# Patient Record
Sex: Male | Born: 1985 | Race: Black or African American | Hispanic: No | Marital: Single | Smoking: Current every day smoker
Health system: Southern US, Community
[De-identification: ages and names within clinical notes are randomized; demographics above are authoritative.]

## PROBLEM LIST (undated history)

## (undated) DIAGNOSIS — S02609A Fracture of mandible, unspecified, initial encounter for closed fracture: Secondary | ICD-10-CM

## (undated) DIAGNOSIS — F319 Bipolar disorder, unspecified: Secondary | ICD-10-CM

## (undated) DIAGNOSIS — F209 Schizophrenia, unspecified: Secondary | ICD-10-CM

## (undated) HISTORY — PX: MOUTH SURGERY: SHX715

---
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
## 2006-06-30 ENCOUNTER — Emergency Department (HOSPITAL_COMMUNITY): Admission: EM | Admit: 2006-06-30 | Discharge: 2006-06-30 | Payer: Self-pay | Admitting: Diagnostic Radiology

## 2006-08-20 ENCOUNTER — Emergency Department (HOSPITAL_COMMUNITY): Admission: EM | Admit: 2006-08-20 | Discharge: 2006-08-20 | Payer: Self-pay | Admitting: Emergency Medicine

## 2007-04-14 ENCOUNTER — Emergency Department (HOSPITAL_COMMUNITY): Admission: EM | Admit: 2007-04-14 | Discharge: 2007-04-14 | Payer: Self-pay | Admitting: Emergency Medicine

## 2007-04-16 ENCOUNTER — Emergency Department (HOSPITAL_COMMUNITY): Admission: EM | Admit: 2007-04-16 | Discharge: 2007-04-16 | Payer: Self-pay | Admitting: Diagnostic Radiology

## 2011-06-23 ENCOUNTER — Emergency Department (HOSPITAL_COMMUNITY): Payer: Medicaid Other

## 2011-06-23 ENCOUNTER — Emergency Department (HOSPITAL_COMMUNITY)
Admission: EM | Admit: 2011-06-23 | Discharge: 2011-06-23 | Disposition: A | Discharge status: Home / Self Care | Payer: Medicaid Other | Attending: Emergency Medicine | Admitting: Emergency Medicine

## 2011-06-23 DIAGNOSIS — Z79899 Other long term (current) drug therapy: Secondary | ICD-10-CM | POA: Insufficient documentation

## 2011-06-23 DIAGNOSIS — F209 Schizophrenia, unspecified: Secondary | ICD-10-CM | POA: Insufficient documentation

## 2011-06-23 DIAGNOSIS — F319 Bipolar disorder, unspecified: Secondary | ICD-10-CM | POA: Insufficient documentation

## 2011-06-23 LAB — COMPREHENSIVE METABOLIC PANEL
ALT: 24 U/L (ref 0–53)
BUN: 9 mg/dL (ref 6–23)
CO2: 27 mEq/L (ref 19–32)
Calcium: 9.1 mg/dL (ref 8.4–10.5)
Creatinine, Ser: 0.76 mg/dL (ref 0.50–1.35)
GFR calc Af Amer: >60 mL/min (ref >60)
GFR calc non Af Amer: >60 mL/min (ref >60)
Glucose, Bld: 101 mg/dL — ABNORMAL HIGH (ref 70–99)
Total Protein: 7 g/dL (ref 6.0–8.3)

## 2011-06-23 LAB — URINALYSIS, ROUTINE W REFLEX MICROSCOPIC
Bilirubin Urine: NEGATIVE
Glucose, UA: NEGATIVE mg/dL
Hgb urine dipstick: NEGATIVE
Ketones, ur: NEGATIVE mg/dL
Protein, ur: NEGATIVE mg/dL
Urobilinogen, UA: 1 mg/dL (ref 0.0–1.0)

## 2011-06-23 LAB — CBC
HCT: 37.3 % — ABNORMAL LOW (ref 39.0–52.0)
Hemoglobin: 13.1 g/dL (ref 13.0–17.0)
MCH: 32.6 pg (ref 26.0–34.0)
MCHC: 35.1 g/dL (ref 30.0–36.0)
RDW: 13.5 % (ref 11.5–15.5)

## 2011-06-23 LAB — DIFFERENTIAL
Eosinophils Relative: 0 % (ref 0–5)
Lymphocytes Relative: 20 % (ref 12–46)
Monocytes Absolute: 0.7 10*3/uL (ref 0.1–1.0)
Monocytes Relative: 5 % (ref 3–12)
Neutro Abs: 9.4 10*3/uL — ABNORMAL HIGH (ref 1.7–7.7)

## 2011-06-23 LAB — ETHANOL: Alcohol, Ethyl (B): <11 mg/dL (ref 0–11)

## 2011-06-23 LAB — RAPID URINE DRUG SCREEN, HOSP PERFORMED
Barbiturates: NOT DETECTED
Cocaine: NOT DETECTED

## 2011-07-29 LAB — RAPID URINE DRUG SCREEN, HOSP PERFORMED
Amphetamines: NOT DETECTED
Amphetamines: NOT DETECTED
Benzodiazepines: NOT DETECTED
Benzodiazepines: NOT DETECTED
Cocaine: NOT DETECTED
Cocaine: NOT DETECTED
Opiates: NOT DETECTED
Tetrahydrocannabinol: POSITIVE — AB

## 2011-07-29 LAB — I-STAT 8, (EC8 V) (CONVERTED LAB)
BUN: 9
Bicarbonate: 24.2 — ABNORMAL HIGH
Glucose, Bld: 115 — ABNORMAL HIGH
pCO2, Ven: 35.9 — ABNORMAL LOW

## 2011-07-29 LAB — COMPREHENSIVE METABOLIC PANEL
Albumin: 3.9
BUN: 4 — ABNORMAL LOW
Creatinine, Ser: 0.9
GFR calc Af Amer: >60
Potassium: 3.4 — ABNORMAL LOW
Total Protein: 6.5

## 2011-07-29 LAB — CBC
HCT: 38.1 — ABNORMAL LOW
MCV: 92
Platelets: 238
RDW: 13.1

## 2011-07-29 LAB — DIFFERENTIAL
Lymphocytes Relative: 30
Monocytes Absolute: 0.8 — ABNORMAL HIGH
Monocytes Relative: 8
Neutro Abs: 6.2

## 2011-07-29 LAB — ETHANOL: Alcohol, Ethyl (B): <5

## 2013-07-22 ENCOUNTER — Emergency Department (HOSPITAL_COMMUNITY)
Admission: EM | Admit: 2013-07-22 | Discharge: 2013-07-22 | Disposition: A | Discharge status: Home / Self Care | Payer: Medicaid Other | Attending: Emergency Medicine | Admitting: Emergency Medicine

## 2013-07-22 DIAGNOSIS — F29 Unspecified psychosis not due to a substance or known physiological condition: Secondary | ICD-10-CM | POA: Insufficient documentation

## 2013-07-22 DIAGNOSIS — R748 Abnormal levels of other serum enzymes: Secondary | ICD-10-CM | POA: Insufficient documentation

## 2013-07-22 DIAGNOSIS — Z008 Encounter for other general examination: Secondary | ICD-10-CM

## 2013-07-22 DIAGNOSIS — Z8659 Personal history of other mental and behavioral disorders: Secondary | ICD-10-CM | POA: Insufficient documentation

## 2013-07-22 DIAGNOSIS — Z91199 Patient's noncompliance with other medical treatment and regimen due to unspecified reason: Secondary | ICD-10-CM | POA: Insufficient documentation

## 2013-07-22 DIAGNOSIS — Z9119 Patient's noncompliance with other medical treatment and regimen: Secondary | ICD-10-CM | POA: Insufficient documentation

## 2013-07-22 DIAGNOSIS — Z79899 Other long term (current) drug therapy: Secondary | ICD-10-CM | POA: Insufficient documentation

## 2013-07-22 LAB — CBC
HCT: 39.9 % (ref 39.0–52.0)
Hemoglobin: 14.6 g/dL (ref 13.0–17.0)
MCH: 32.7 pg (ref 26.0–34.0)
MCHC: 36.6 g/dL — ABNORMAL HIGH (ref 30.0–36.0)

## 2013-07-22 LAB — COMPREHENSIVE METABOLIC PANEL WITH GFR
ALT: 57 U/L — ABNORMAL HIGH (ref 0–53)
AST: 183 U/L — ABNORMAL HIGH (ref 0–37)
Albumin: 4.6 g/dL (ref 3.5–5.2)
Alkaline Phosphatase: 52 U/L (ref 39–117)
BUN: 18 mg/dL (ref 6–23)
CO2: 23 meq/L (ref 19–32)
Calcium: 9.5 mg/dL (ref 8.4–10.5)
Chloride: 101 meq/L (ref 96–112)
Creatinine, Ser: 0.98 mg/dL (ref 0.50–1.35)
GFR calc Af Amer: >90 mL/min
GFR calc non Af Amer: >90 mL/min
Glucose, Bld: 167 mg/dL — ABNORMAL HIGH (ref 70–99)
Potassium: 3.4 meq/L — ABNORMAL LOW (ref 3.5–5.1)
Sodium: 138 meq/L (ref 135–145)
Total Bilirubin: 1.5 mg/dL — ABNORMAL HIGH (ref 0.3–1.2)
Total Protein: 7.7 g/dL (ref 6.0–8.3)

## 2013-07-22 LAB — ETHANOL: Alcohol, Ethyl (B): <11 mg/dL (ref 0–11)

## 2013-07-22 MED ORDER — HALOPERIDOL LACTATE 5 MG/ML IJ SOLN
5.0000 mg | Freq: Once | INTRAMUSCULAR | Status: AC
  Administered 2013-07-22: 5 mg via INTRAMUSCULAR
  Filled 2013-07-22: qty 1

## 2013-07-22 MED ORDER — DIPHENHYDRAMINE HCL 50 MG/ML IJ SOLN
50.0000 mg | Freq: Once | INTRAMUSCULAR | Status: AC
  Administered 2013-07-22: 50 mg via INTRAMUSCULAR
  Filled 2013-07-22: qty 1

## 2013-07-22 NOTE — ED Notes (Signed)
Attempted to call [facility] multiple times without success Charge Nurse aware

## 2013-07-22 NOTE — ED Notes (Signed)
Lab results faxed to [facility] at [facility] [phone]

## 2013-07-22 NOTE — ED Notes (Signed)
Update given to [facility] at [facility] All questions answered Patient to be taken back to [facility]

## 2013-07-22 NOTE — ED Notes (Signed)
Patient calm, cooperative and states that he "feels like a new person." Patient meets criteria for removal of restraints, per protocol

## 2013-07-22 NOTE — ED Provider Notes (Signed)
CSN: [id number]     Arrival date & time 07/22/13  4098 History   First MD Initiated Contact with Patient 07/22/13 [phone]     Chief Complaint  Patient presents with  . Medical Clearance   (Consider location/radiation/quality/duration/timing/severity/associated sxs/prior Treatment) HPI 27 year old male presents to emergency department from [facility] with complaint of aggression, agitation.  Patient has been IVC by family to to worsening behavior.  He has a history of bipolar, substance abuse, schizophrenia.  Patient has been prescribed, Cogentin, Haldol.  He has not been taking it.  He has refused these medications at [facility].  He presents for medical clearance and possibly IM medication for his behavior.  Unable to get history from patient as he is responding to internal stimuli, is agitated, and fixated on his parents causing all of his problems. No past medical history on file. No past surgical history on file. No family history on file. History  Substance Use Topics  . Smoking status: Not on file  . Smokeless tobacco: Not on file  . Alcohol Use: Not on file    Review of Systems  Unable to perform ROS: Psychiatric disorder    Allergies  Review of patient's allergies indicates no known allergies.  Home Medications   Current Outpatient Rx  Name  Route  Sig  Dispense  Refill  . benztropine (COGENTIN) 1 MG tablet   Oral   Take 1 mg by mouth daily.         . haloperidol (HALDOL) 5 MG tablet   Oral   Take 5 mg by mouth daily.         Marland Kitchen LORazepam (ATIVAN) 1 MG tablet   Oral   Take 1 mg by mouth every 6 (six) hours as needed for anxiety.          BP 134/64  Pulse 60  Temp(Src) 98.8 F (37.1 C) (Oral)  Resp 16  SpO2 100% Physical Exam  Constitutional: He appears distressed.  Thin male, agitated, verbally abusive, yelling  HENT:  Head: Normocephalic and atraumatic.  Nose: Nose normal.  Mouth/Throat: Oropharynx is clear and moist.  Eyes:  Conjunctivae and EOM are normal. Pupils are equal, round, and reactive to light.  Neck: Normal range of motion. Neck supple. No JVD present. No tracheal deviation present. No thyromegaly present.  Cardiovascular: Normal rate, regular rhythm, normal heart sounds and intact distal pulses.  Exam reveals no gallop and no friction rub.   No murmur heard. Pulmonary/Chest: Effort normal and breath sounds normal. No stridor. No respiratory distress. He has no wheezes. He has no rales. He exhibits no tenderness.  Abdominal: Soft. Bowel sounds are normal. He exhibits no distension and no mass. There is no tenderness. There is no rebound and no guarding.  Musculoskeletal: Normal range of motion. He exhibits no edema and no tenderness.  Lymphadenopathy:    He has no cervical adenopathy.  Neurological: He is alert. He exhibits normal muscle tone. Coordination normal.  Skin: Skin is warm and dry. No rash noted. No erythema. No pallor.  Psychiatric:  Patient is psychotic, responding to internal stimuli, agitated    ED Course  Procedures (including critical care time) Labs Review Labs Reviewed  CBC - Abnormal; Notable for the following:    WBC 12.4 (*)    MCHC 36.6 (*)    All other components within normal limits  COMPREHENSIVE METABOLIC PANEL - Abnormal; Notable for the following:    Potassium 3.4 (*)    Glucose, Bld 167 (*)  AST 183 (*)    ALT 57 (*)    Total Bilirubin 1.5 (*)    All other components within normal limits  ETHANOL  URINE RAPID DRUG SCREEN (HOSP PERFORMED)   Imaging Review No results found.  EKG Interpretation   None       MDM   1. Psychosis    27 year old male with psychosis.  Plan for Haldol and Benadryl.  Ativan as needed.  After he is calmer, will plan for medical clearance labs.  Eventual plan is to return to [place] once calmer.   6:12 AM Pt much calmer.  Labs resulted, elevated AST/ALT and slight elevation in bilirubin.  Possibly from heavy alcohol use.   Will need follow up as outpatient.  No signs of liver failure, jaundice on exam.  Olivia Mackie, MD 07/22/13 [phone]

## 2013-07-22 NOTE — ED Notes (Signed)
Bed: ZO10 Expected date:  Expected time:  Means of arrival:  Comments: [facility] pt coming

## 2013-07-22 NOTE — ED Notes (Signed)
Pt brought in from [facility]-pt aggressive, threatening staff, agitated-will not take po meds-pt is under IVC, here for control and medical clearance.

## 2013-07-23 ENCOUNTER — Emergency Department (HOSPITAL_COMMUNITY)
Admission: EM | Admit: 2013-07-23 | Discharge: 2013-07-23 | Disposition: A | Discharge status: Other Acute Inpatient Hospital | Payer: Medicaid Other | Attending: Emergency Medicine | Admitting: Emergency Medicine

## 2013-07-23 ENCOUNTER — Encounter (HOSPITAL_COMMUNITY): Payer: Self-pay | Admitting: Emergency Medicine

## 2013-07-23 DIAGNOSIS — F209 Schizophrenia, unspecified: Secondary | ICD-10-CM | POA: Insufficient documentation

## 2013-07-23 DIAGNOSIS — Z79899 Other long term (current) drug therapy: Secondary | ICD-10-CM | POA: Insufficient documentation

## 2013-07-23 DIAGNOSIS — IMO0002 Reserved for concepts with insufficient information to code with codable children: Secondary | ICD-10-CM | POA: Insufficient documentation

## 2013-07-23 DIAGNOSIS — F29 Unspecified psychosis not due to a substance or known physiological condition: Secondary | ICD-10-CM | POA: Insufficient documentation

## 2013-07-23 DIAGNOSIS — F319 Bipolar disorder, unspecified: Secondary | ICD-10-CM | POA: Insufficient documentation

## 2013-07-23 DIAGNOSIS — F17200 Nicotine dependence, unspecified, uncomplicated: Secondary | ICD-10-CM | POA: Insufficient documentation

## 2013-07-23 HISTORY — DX: Bipolar disorder, unspecified: F31.9

## 2013-07-23 HISTORY — DX: Schizophrenia, unspecified: F20.9

## 2013-07-23 MED ORDER — ZIPRASIDONE MESYLATE 20 MG IM SOLR
20.0000 mg | Freq: Once | INTRAMUSCULAR | Status: AC
  Administered 2013-07-23: 20 mg via INTRAMUSCULAR
  Filled 2013-07-23: qty 20

## 2013-07-23 MED ORDER — STERILE WATER FOR INJECTION IJ SOLN
INTRAMUSCULAR | Status: AC
  Administered 2013-07-23: 10 mL
  Filled 2013-07-23: qty 10

## 2013-07-23 NOTE — ED Provider Notes (Signed)
CSN: [id number]     Arrival date & time 07/23/13  0042 History   First MD Initiated Contact with Patient 07/23/13 0059     Chief Complaint  Patient presents with  . Medication Management   (Consider location/radiation/quality/duration/timing/severity/associated sxs/prior Treatment) HPI History provided by patient and police. Patient currently under IVC for psychosis with history of schizophrenia,  Bipolar and manic depression and polysubstance abuse.  He had been refusing medications at [facility] and had eloped from that facility tonight.  Police found him and bring him to [facility] long emergency department with orders from [place] to state, if patient is refusing oral medications, the emergency department to "force medications".    In the emergency department, patient remained psychotic police bedside, patient in hand cuffs and ankle cuffs.  Patient tells me that he doesn't need any medications other than marijuana. He refuses antipsychotics do to fear of gynecomastia - he cites a commercial that he saw on TV. He denies any suicidal ideations. Patient is very agitated and expresses desire to not being emergency room and wanting to leave. Has pressured speech, is clearly agitated. Symptoms moderate to severe.   Past Medical History  Diagnosis Date  . Bipolar 1 disorder   . Manic depression   . Schizophrenia    History reviewed. No pertinent past surgical history. No family history on file. History  Substance Use Topics  . Smoking status: Current Every Day Smoker  . Smokeless tobacco: Never Used  . Alcohol Use: Yes    Review of Systems  Constitutional: Negative for fever and chills.  Eyes: Negative for visual disturbance.  Respiratory: Negative for shortness of breath.   Cardiovascular: Negative for chest pain.  Gastrointestinal: Negative for abdominal pain.  Genitourinary: Negative for dysuria.  Musculoskeletal: Negative for back pain, neck pain and neck stiffness.  Skin: Negative  for rash.  Neurological: Negative for headaches.  Psychiatric/Behavioral: Positive for agitation. Negative for self-injury.  All other systems reviewed and are negative.    Allergies  Review of patient's allergies indicates no known allergies.  Home Medications   Current Outpatient Rx  Name  Route  Sig  Dispense  Refill  . benztropine (COGENTIN) 1 MG tablet   Oral   Take 1 mg by mouth daily.         . haloperidol (HALDOL) 5 MG tablet   Oral   Take 5 mg by mouth daily.         Marland Kitchen LORazepam (ATIVAN) 1 MG tablet   Oral   Take 1 mg by mouth every 6 (six) hours as needed for anxiety.          BP 136/86  Pulse 62  Resp 16  SpO2 100% Physical Exam  Constitutional: He appears well-developed and well-nourished.  HENT:  Head: Normocephalic and atraumatic.  Eyes: EOM are normal. Pupils are equal, round, and reactive to light.  Neck: Neck supple.  Cardiovascular: Normal rate, regular rhythm and intact distal pulses.   Pulmonary/Chest: Effort normal and breath sounds normal. No respiratory distress.  Abdominal: Soft. He exhibits no distension. There is no tenderness.  Musculoskeletal: Normal range of motion. He exhibits no edema.  Neurological:  Awake, alert and oriented, gait intact, no focal deficits  Skin: Skin is warm and dry.  Psychiatric:  Psychotic behavior, agitation, pressured speech    ED Course  Procedures (including critical care time)   Results for orders placed during the hospital encounter of 07/22/13  CBC      Result Value Range  WBC 12.4 (*) 4.0 - 10.5 K/uL   RBC 4.47  4.22 - 5.81 MIL/uL   Hemoglobin 14.6  13.0 - 17.0 g/dL   HCT 40.9  81.1 - 91.4 %   MCV 89.3  78.0 - 100.0 fL   MCH 32.7  26.0 - 34.0 pg   MCHC 36.6 (*) 30.0 - 36.0 g/dL   RDW 78.2  95.6 - 21.3 %   Platelets 265  150 - 400 K/uL  COMPREHENSIVE METABOLIC PANEL      Result Value Range   Sodium 138  135 - 145 mEq/L   Potassium 3.4 (*) 3.5 - 5.1 mEq/L   Chloride 101  96 - 112  mEq/L   CO2 23  19 - 32 mEq/L   Glucose, Bld 167 (*) 70 - 99 mg/dL   BUN 18  6 - 23 mg/dL   Creatinine, Ser 0.86  0.50 - 1.35 mg/dL   Calcium 9.5  8.4 - 57.8 mg/dL   Total Protein 7.7  6.0 - 8.3 g/dL   Albumin 4.6  3.5 - 5.2 g/dL   AST 469 (*) 0 - 37 U/L   ALT 57 (*) 0 - 53 U/L   Alkaline Phosphatase 52  39 - 117 U/L   Total Bilirubin 1.5 (*) 0.3 - 1.2 mg/dL   GFR calc non Af Amer >90  >90 mL/min   GFR calc Af Amer >90  >90 mL/min  ETHANOL      Result Value Range   Alcohol, Ethyl (B) <11  0 - 11 mg/dL   Records from ED visit reviewed including labs as above.  Patient had been medically cleared yesterday and sent to [facility].  He is under IVC  Geodon 20 mg IM provided  Case discussed with [facility] and they're willing to take patient back tonight without repeat labs, as long as medications can be provided in the emergency department. Psychiatrist writing orders at [facility] is Dr. Wonda Cerise  2:27 AM patient more calm and is now agreeable.  Discussed again with [facility] and they will take patient back to their facility.  MDM  Dx: Psychosis, IVC  Prior records and labs reviewed as above. Geodon IM provided and condition improved Vital signs and nursing notes reviewed and considered    Sunnie Nielsen, MD 07/23/13 [phone]

## 2013-07-23 NOTE — ED Notes (Signed)
Pt from [facility] was refusing to take Zyprexa 10 mg. Diverted here for medication to be given. Pt is IVC. [facility] states, they "want pt under control before they will accept him back."

## 2013-08-30 ENCOUNTER — Encounter (HOSPITAL_COMMUNITY): Payer: Self-pay | Admitting: Emergency Medicine

## 2013-08-30 ENCOUNTER — Emergency Department (HOSPITAL_COMMUNITY)
Admission: EM | Admit: 2013-08-30 | Discharge: 2013-08-31 | Disposition: A | Discharge status: Home / Self Care | Payer: Medicaid Other | Attending: Emergency Medicine | Admitting: Emergency Medicine

## 2013-08-30 DIAGNOSIS — F17200 Nicotine dependence, unspecified, uncomplicated: Secondary | ICD-10-CM | POA: Insufficient documentation

## 2013-08-30 DIAGNOSIS — Z79899 Other long term (current) drug therapy: Secondary | ICD-10-CM | POA: Insufficient documentation

## 2013-08-30 DIAGNOSIS — F309 Manic episode, unspecified: Secondary | ICD-10-CM | POA: Insufficient documentation

## 2013-08-30 DIAGNOSIS — F209 Schizophrenia, unspecified: Secondary | ICD-10-CM | POA: Insufficient documentation

## 2013-08-30 DIAGNOSIS — F3110 Bipolar disorder, current episode manic without psychotic features, unspecified: Secondary | ICD-10-CM

## 2013-08-30 LAB — CBC
HCT: 41.4 % (ref 39.0–52.0)
MCH: 32.7 pg (ref 26.0–34.0)
MCHC: 34.8 g/dL (ref 30.0–36.0)
MCV: 94.1 fL (ref 78.0–100.0)
Platelets: 233 10*3/uL (ref 150–400)
RDW: 12.6 % (ref 11.5–15.5)
WBC: 12 10*3/uL — ABNORMAL HIGH (ref 4.0–10.5)

## 2013-08-30 LAB — SALICYLATE LEVEL: Salicylate Lvl: <2 mg/dL — ABNORMAL LOW (ref 2.8–20.0)

## 2013-08-30 LAB — COMPREHENSIVE METABOLIC PANEL
AST: 35 U/L (ref 0–37)
Albumin: 4.8 g/dL (ref 3.5–5.2)
BUN: 13 mg/dL (ref 6–23)
CO2: 26 mEq/L (ref 19–32)
Calcium: 10.2 mg/dL (ref 8.4–10.5)
Chloride: 101 mEq/L (ref 96–112)
Creatinine, Ser: 0.93 mg/dL (ref 0.50–1.35)
GFR calc non Af Amer: >90 mL/min (ref >90)
Sodium: 138 mEq/L (ref 135–145)
Total Bilirubin: 0.5 mg/dL (ref 0.3–1.2)

## 2013-08-30 LAB — ETHANOL: Alcohol, Ethyl (B): <11 mg/dL (ref 0–11)

## 2013-08-30 LAB — RAPID URINE DRUG SCREEN, HOSP PERFORMED
Barbiturates: NOT DETECTED
Cocaine: NOT DETECTED

## 2013-08-30 LAB — ACETAMINOPHEN LEVEL: Acetaminophen (Tylenol), Serum: <15 ug/mL (ref 10–30)

## 2013-08-30 MED ORDER — BENZTROPINE MESYLATE 1 MG PO TABS
1.0000 mg | ORAL_TABLET | Freq: Every day | ORAL | Status: DC
  Administered 2013-08-30 – 2013-08-31 (×2): 1 mg via ORAL
  Filled 2013-08-30 (×2): qty 1

## 2013-08-30 MED ORDER — HALOPERIDOL 5 MG PO TABS
5.0000 mg | ORAL_TABLET | Freq: Every day | ORAL | Status: DC
  Administered 2013-08-30 – 2013-08-31 (×2): 5 mg via ORAL
  Filled 2013-08-30 (×2): qty 1

## 2013-08-30 NOTE — ED Notes (Signed)
Pt refuse his vitals 

## 2013-08-30 NOTE — ED Notes (Signed)
Gave patient sandwich and juice

## 2013-08-30 NOTE — ED Notes (Signed)
Pt presents with c/o of medical clearance . Recently incarcerated not following prescribed medication regimen. Hx drug abuse, bipolar, schizophrenic, manic depressive. Family concerned for safety.

## 2013-08-30 NOTE — ED Notes (Signed)
[employer] at bedside 

## 2013-08-30 NOTE — ED Notes (Signed)
Pt denies SI or HI .  
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
## 2013-08-31 ENCOUNTER — Encounter (HOSPITAL_COMMUNITY): Payer: Self-pay | Admitting: Registered Nurse

## 2013-08-31 DIAGNOSIS — Z79899 Other long term (current) drug therapy: Secondary | ICD-10-CM

## 2013-08-31 DIAGNOSIS — F3110 Bipolar disorder, current episode manic without psychotic features, unspecified: Secondary | ICD-10-CM

## 2013-08-31 MED ORDER — ZIPRASIDONE MESYLATE 20 MG IM SOLR: 10.0000 mg | Freq: Once | INTRAMUSCULAR | Status: DC

## 2013-08-31 MED ORDER — ZOLPIDEM TARTRATE 10 MG PO TABS
10.0000 mg | ORAL_TABLET | Freq: Every evening | ORAL | Status: DC | PRN
  Administered 2013-08-31: 10 mg via ORAL
  Filled 2013-08-31: qty 1

## 2013-08-31 MED ORDER — HALOPERIDOL 5 MG PO TABS: 5.0000 mg | ORAL_TABLET | Freq: Every day | ORAL | Status: DC

## 2013-08-31 MED ORDER — LORAZEPAM 2 MG/ML IJ SOLN: 1.0000 mg | Freq: Once | INTRAMUSCULAR | Status: DC

## 2013-08-31 MED ORDER — BENZTROPINE MESYLATE 1 MG PO TABS: 1.0000 mg | ORAL_TABLET | Freq: Every day | ORAL | Status: DC

## 2013-08-31 MED ORDER — ZOLPIDEM TARTRATE 10 MG PO TABS: 10.0000 mg | ORAL_TABLET | Freq: Every evening | ORAL | Status: DC | PRN

## 2013-08-31 NOTE — ED Notes (Signed)
Pt cussing at the police officer on duty . Pt upset that he is IVC'd. Staff explained the door will have to be closed because he is disrupting the unit. Pt yelled" I dont give a fuck. Close the damn door then."

## 2013-08-31 NOTE — ED Notes (Signed)
Pt oriented to unit. Pt requested something for his bunion of his foot. Staff offered gauze to help cushion area. Pt refused. Pt cooperative but slightly anxious. Pt states he is upset with police for bringing him in here. States he does not even want to look at them.
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
## 2013-08-31 NOTE — [facility] Counselor (Addendum)
1015 Pt given bus pass.   1610 Dr. Carolanne Grumbling has reversed the IVC. Original IVC reversal paperwork placed in white IVC NB in psych ED and copy placed in pt's chart. Pt is to be d/c later today when transportation arranged.  Evette Cristal, [place] Assessment Counselor

## 2013-08-31 NOTE — ED Notes (Signed)
Pt stopped yelling and opened his door. Pt laying in bed more calmly. Pt talking to himself in the bed.

## 2013-08-31 NOTE — ED Notes (Signed)
Pt states he was all hyped up to go home and so he feels very anxious and will not be able to sleep. Pt requesting something to help hi, sleep. MD notified,  orders received.

## 2013-08-31 NOTE — ED Notes (Signed)
Patient personal belongings are now in locker 19

## 2013-08-31 NOTE — Consult Note (Signed)
  Subjective:  Patient states that he was brought to hospital straight from jail.  "They wanted to make sure that I was taking my medication and not having any problems.  Yes I will be able to go back home.  I live with my Grandma."  Patient denies suicidal/homicidal ideation, psychosis, and paranoia.  Patient appears pleasant with no pressured speech, and no signs of mania at this time.  Patient organized, coherent and goal directed.  Patient states that he goes to [facility] for outpatient services (medication management).  Patient does not meet criteria for inpatient treatment at this time.  Psychiatric Specialty Exam: Physical Exam  Constitutional: He is oriented to person, place, and time.  Neck: Normal range of motion.  Respiratory: Effort normal.  Musculoskeletal: Normal range of motion.  Neurological: He is alert and oriented to person, place, and time.  Skin: Skin is warm and dry.  Psychiatric: He has a normal mood and affect. His speech is normal and behavior is normal. Judgment and thought content normal. Cognition and memory are normal.    ROS  Blood pressure 108/66, pulse 58, temperature 97.7 F (36.5 C), temperature source Oral, resp. rate 18, SpO2 100.00%.There is no height or weight on file to calculate BMI.  General Appearance: Casual and Fairly Groomed  Eye Contact::  Good  Speech:  Clear and Coherent and Normal Rate  Volume:  Normal  Mood:  "I feel good. I'm ready to go home"  Affect:  Appropriate and Congruent  Thought Process:  NA, Coherent and Goal Directed  Orientation:  Full (Time, Place, and Person)  Thought Content:  WDL  Suicidal Thoughts:  No  Homicidal Thoughts:  No  Memory:  Immediate;   Good Recent;   Good Remote;   Good  Judgement:  Good  Insight:  Present  Psychomotor Activity:  Normal  Concentration:  Good  Recall:  Good  Akathisia:  No  Handed:  Right  AIMS (if indicated):     Assets:  Communication Skills Desire for  Improvement Housing Social Support Transportation  Sleep:      Face to face interview/consult with Dr. Ladona Ridgel  Disposition:  Discharge home.  Write 30 prescription for medications and patient to follow up with [facility] (walk in 8:00 am to 3:00 pm).  Brazil Voytko B. Dawnell Bryant FNP-BC Family Nurse Practitioner, Board Certified

## 2013-08-31 NOTE — ED Notes (Signed)
Patient will be transferred to 15

## 2013-08-31 NOTE — ED Notes (Signed)
Patient is concerns about bil bunion pain. Request for referral to podiatrist.

## 2013-08-31 NOTE — ED Provider Notes (Signed)
Pt cleared for discharge per psychiatry.  Celene Kras, MD 08/31/13 [phone]

## 2013-08-31 NOTE — ED Notes (Signed)
Pt up in room doing push ups and jumping jacks. Pt states " I need to get out of here soon" It was explained the psychiatrist does morning rounds and he will have to wait to speak with him.

## 2013-09-06 ENCOUNTER — Encounter (HOSPITAL_COMMUNITY): Payer: Self-pay | Admitting: Emergency Medicine

## 2013-09-06 ENCOUNTER — Emergency Department (HOSPITAL_COMMUNITY)
Admission: EM | Admit: 2013-09-06 | Discharge: 2013-09-07 | Disposition: A | Discharge status: Home / Self Care | Payer: Medicaid Other | Attending: Emergency Medicine | Admitting: Emergency Medicine

## 2013-09-06 DIAGNOSIS — F209 Schizophrenia, unspecified: Secondary | ICD-10-CM | POA: Insufficient documentation

## 2013-09-06 DIAGNOSIS — F22 Delusional disorders: Secondary | ICD-10-CM | POA: Insufficient documentation

## 2013-09-06 DIAGNOSIS — F319 Bipolar disorder, unspecified: Secondary | ICD-10-CM | POA: Insufficient documentation

## 2013-09-06 DIAGNOSIS — F17200 Nicotine dependence, unspecified, uncomplicated: Secondary | ICD-10-CM | POA: Insufficient documentation

## 2013-09-06 DIAGNOSIS — Z79899 Other long term (current) drug therapy: Secondary | ICD-10-CM | POA: Insufficient documentation

## 2013-09-06 LAB — CBC
HCT: 35.8 % — ABNORMAL LOW (ref 39.0–52.0)
Hemoglobin: 12.4 g/dL — ABNORMAL LOW (ref 13.0–17.0)
MCV: 93.7 fL (ref 78.0–100.0)
RBC: 3.82 MIL/uL — ABNORMAL LOW (ref 4.22–5.81)
WBC: 10.1 10*3/uL (ref 4.0–10.5)

## 2013-09-06 LAB — COMPREHENSIVE METABOLIC PANEL
Alkaline Phosphatase: 62 U/L (ref 39–117)
BUN: 14 mg/dL (ref 6–23)
Chloride: 103 mEq/L (ref 96–112)
Creatinine, Ser: 0.83 mg/dL (ref 0.50–1.35)
GFR calc Af Amer: >90 mL/min (ref >90)
GFR calc non Af Amer: >90 mL/min (ref >90)
Glucose, Bld: 106 mg/dL — ABNORMAL HIGH (ref 70–99)
Potassium: 4 mEq/L (ref 3.5–5.1)
Total Bilirubin: 0.2 mg/dL — ABNORMAL LOW (ref 0.3–1.2)

## 2013-09-06 LAB — RAPID URINE DRUG SCREEN, HOSP PERFORMED
Barbiturates: NOT DETECTED
Cocaine: NOT DETECTED
Tetrahydrocannabinol: POSITIVE — AB

## 2013-09-06 LAB — ETHANOL: Alcohol, Ethyl (B): <11 mg/dL (ref 0–11)

## 2013-09-06 NOTE — Consult Note (Signed)
Note reviewed and agreed with  
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
## 2013-09-06 NOTE — ED Notes (Signed)
Pt presents from [place] with , per [facility] pt needs to be strongly encouraged to take meds and then medically cleared so he can be taken back to [facility].  Pt is IVC by [employer]

## 2013-09-07 MED ORDER — LORAZEPAM 1 MG PO TABS
1.0000 mg | ORAL_TABLET | Freq: Once | ORAL | Status: AC
  Administered 2013-09-07: 1 mg via ORAL
  Filled 2013-09-07: qty 1

## 2013-09-07 NOTE — Discharge Instructions (Signed)
Followup with [facility] as planned.     Schizophrenia Schizophrenia is a mental illness. It may cause disturbed or disorganized thinking, speech, or behavior. People with schizophrenia have problems functioning in one or more areas of life: work, school, home, or relationships. People with schizophrenia are at increased risk for suicide, certain chronic physical illnesses, and unhealthy behaviors, such as smoking and drug use. People who have family members with schizophrenia are at higher risk of developing the illness. Schizophrenia affects men and women equally but usually appears at an earlier age (teenage or early adult years) in men.  SYMPTOMS The earliest symptoms are often subtle (prodrome) and may go unnoticed until the illness becomes more severe (first-break psychosis). Symptoms of schizophrenia may be continuous or may come and go in severity. Episodes often are triggered by major life events, such as family stress, college, [employer], marriage, pregnancy or child birth, divorce, or loss of a loved one. People with schizophrenia may see, hear, or feel things that do not exist (hallucinations). They may have false beliefs in spite of obvious proof to the contrary (delusions). Sometimes speech is incoherent or behavior is odd or withdrawn.  DIAGNOSIS Schizophrenia is diagnosed through an assessment by your caregiver. Your caregiver will ask questions about your thoughts, behavior, mood, and ability to function in daily life. Your caregiver may ask questions about your medical history and use of alcohol or drugs, including prescription medication. Your caregiver may also order blood tests and imaging exams. Certain medical conditions and substances can cause symptoms that resemble schizophrenia. Your caregiver may refer you to a mental health specialist for evaluation. There are three major criterion for a diagnosis of schizophrenia:  Two or more of the following five symptoms are present  for a month or longer:  Delusions. Often the delusions are that you are being attacked, harassed, cheated, persecuted or conspired against (persecutory delusions).  Hallucinations.   Disorganized speech that does not make sense to others.  Grossly disorganized (confused or unfocused) behavior or extremely overactive or underactive motor activity (catatonia).  Negative symptoms such as bland or blunted emotions (flat affect), loss of will power (avolition), and withdrawal from social contacts (social isolation).  Level of functioning in one or more major areas of life (work, school, relationships, or self-care) is markedly below the level of functioning before the onset of illness.   There are continuous signs of illness (either mild symptoms or decreased level of functioning) for at least 6 months or longer. TREATMENT  Schizophrenia is a long-term illness. It is best controlled with continuous treatment rather than treatment only when symptoms occur. The following treatments are used to manage schizophrenia:  Medication Medication is the most effective and important form of treatment for schizophrenia. Antipsychotic medications are usually prescribed to help manage schizophrenia. Other types of medication may be added to relieve any symptoms that may occur despite the use of antipsychotic medications.  Counseling or talk therapy Individual, group, or family counseling may be helpful in providing education, support, and guidance. Many people with schizophrenia also benefit from social skills and job skills (vocational) training. A combination of medication and counseling is best for managing the disorder over time. A procedure in which electricity is applied to the brain through the scalp (electroconvulsive therapy) may be used to treat catatonic schizophrenia or schizophrenia in people who cannot take or do not respond to medication and counseling. Document Released: 09/26/2000 Document  Revised: 06/01/2013 Document Reviewed: 12/22/2012 [facility] Patient Information 2014 [place], [place].

## 2013-09-07 NOTE — ED Notes (Signed)
Pt discharged back to [facility] with officer; report called to [facility] at [facility]

## 2013-09-09 NOTE — ED Provider Notes (Signed)
Medical screening examination/treatment/procedure(s) were performed by non-physician practitioner and as supervising physician I was immediately available for consultation/collaboration.  EKG Interpretation   None        Marylin Lathon, MD 09/09/13 0836 

## 2013-09-16 ENCOUNTER — Emergency Department (HOSPITAL_COMMUNITY): Payer: Medicaid Other

## 2013-09-16 ENCOUNTER — Encounter (HOSPITAL_COMMUNITY): Payer: Self-pay | Admitting: Emergency Medicine

## 2013-09-16 ENCOUNTER — Emergency Department (HOSPITAL_COMMUNITY)
Admission: EM | Admit: 2013-09-16 | Discharge: 2013-09-16 | Disposition: A | Discharge status: Home / Self Care | Payer: Medicaid Other | Attending: Emergency Medicine | Admitting: Emergency Medicine

## 2013-09-16 DIAGNOSIS — F3130 Bipolar disorder, current episode depressed, mild or moderate severity, unspecified: Secondary | ICD-10-CM | POA: Insufficient documentation

## 2013-09-16 DIAGNOSIS — S6990XA Unspecified injury of unspecified wrist, hand and finger(s), initial encounter: Secondary | ICD-10-CM | POA: Insufficient documentation

## 2013-09-16 DIAGNOSIS — S59909A Unspecified injury of unspecified elbow, initial encounter: Secondary | ICD-10-CM | POA: Insufficient documentation

## 2013-09-16 DIAGNOSIS — F209 Schizophrenia, unspecified: Secondary | ICD-10-CM | POA: Insufficient documentation

## 2013-09-16 DIAGNOSIS — F17200 Nicotine dependence, unspecified, uncomplicated: Secondary | ICD-10-CM | POA: Insufficient documentation

## 2013-09-16 DIAGNOSIS — M79602 Pain in left arm: Secondary | ICD-10-CM

## 2013-09-16 DIAGNOSIS — Z79899 Other long term (current) drug therapy: Secondary | ICD-10-CM | POA: Insufficient documentation

## 2013-09-16 DIAGNOSIS — F1210 Cannabis abuse, uncomplicated: Secondary | ICD-10-CM | POA: Insufficient documentation

## 2013-09-16 LAB — RAPID URINE DRUG SCREEN, HOSP PERFORMED
Amphetamines: NOT DETECTED
Benzodiazepines: NOT DETECTED
Cocaine: NOT DETECTED
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
## 2013-09-17 ENCOUNTER — Emergency Department (HOSPITAL_COMMUNITY): Payer: Medicaid Other | Admitting: Anesthesiology

## 2013-09-17 ENCOUNTER — Encounter (HOSPITAL_COMMUNITY): Payer: Self-pay | Admitting: Emergency Medicine

## 2013-09-17 ENCOUNTER — Emergency Department (HOSPITAL_COMMUNITY): Payer: Medicaid Other

## 2013-09-17 ENCOUNTER — Ambulatory Visit (HOSPITAL_COMMUNITY)
Admission: EM | Admit: 2013-09-17 | Discharge: 2013-09-17 | Disposition: A | Discharge status: Home / Self Care | Payer: Medicaid Other | Attending: Emergency Medicine | Admitting: Emergency Medicine

## 2013-09-17 ENCOUNTER — Encounter (HOSPITAL_COMMUNITY)
Admission: EM | Disposition: A | Discharge status: Home / Self Care | Payer: Self-pay | Source: Home / Self Care | Attending: Emergency Medicine

## 2013-09-17 ENCOUNTER — Encounter (HOSPITAL_COMMUNITY): Payer: Medicaid Other | Admitting: Anesthesiology

## 2013-09-17 DIAGNOSIS — S0266XB Fracture of symphysis of mandible, initial encounter for open fracture: Secondary | ICD-10-CM | POA: Insufficient documentation

## 2013-09-17 DIAGNOSIS — F319 Bipolar disorder, unspecified: Secondary | ICD-10-CM | POA: Insufficient documentation

## 2013-09-17 DIAGNOSIS — J9600 Acute respiratory failure, unspecified whether with hypoxia or hypercapnia: Secondary | ICD-10-CM

## 2013-09-17 DIAGNOSIS — S025XXA Fracture of tooth (traumatic), initial encounter for closed fracture: Secondary | ICD-10-CM | POA: Insufficient documentation

## 2013-09-17 DIAGNOSIS — F17200 Nicotine dependence, unspecified, uncomplicated: Secondary | ICD-10-CM | POA: Insufficient documentation

## 2013-09-17 DIAGNOSIS — F2089 Other schizophrenia: Secondary | ICD-10-CM | POA: Insufficient documentation

## 2013-09-17 DIAGNOSIS — F0781 Postconcussional syndrome: Secondary | ICD-10-CM

## 2013-09-17 DIAGNOSIS — S02609A Fracture of mandible, unspecified, initial encounter for closed fracture: Secondary | ICD-10-CM

## 2013-09-17 DIAGNOSIS — S02650B Fracture of angle of mandible, unspecified side, initial encounter for open fracture: Secondary | ICD-10-CM | POA: Insufficient documentation

## 2013-09-17 DIAGNOSIS — S060X1A Concussion with loss of consciousness of 30 minutes or less, initial encounter: Secondary | ICD-10-CM

## 2013-09-17 DIAGNOSIS — S032XXA Dislocation of tooth, initial encounter: Secondary | ICD-10-CM

## 2013-09-17 DIAGNOSIS — S02609B Fracture of mandible, unspecified, initial encounter for open fracture: Secondary | ICD-10-CM

## 2013-09-17 HISTORY — DX: Fracture of mandible, unspecified, initial encounter for closed fracture: S02.609A

## 2013-09-17 HISTORY — PX: ORIF MANDIBULAR FRACTURE: SHX2127

## 2013-09-17 LAB — URINALYSIS, ROUTINE W REFLEX MICROSCOPIC
Bilirubin Urine: NEGATIVE
Glucose, UA: NEGATIVE mg/dL
Hgb urine dipstick: NEGATIVE
Ketones, ur: NEGATIVE mg/dL
Leukocytes, UA: NEGATIVE
Nitrite: NEGATIVE
Protein, ur: NEGATIVE mg/dL

## 2013-09-17 LAB — CBC WITH DIFFERENTIAL/PLATELET
Basophils Absolute: 0 10*3/uL (ref 0.0–0.1)
Basophils Relative: 0 % (ref 0–1)
Eosinophils Absolute: 0 10*3/uL (ref 0.0–0.7)
Eosinophils Relative: 0 % (ref 0–5)
HCT: 38 % — ABNORMAL LOW (ref 39.0–52.0)
MCH: 32.7 pg (ref 26.0–34.0)
MCHC: 34.2 g/dL (ref 30.0–36.0)
MCV: 95.5 fL (ref 78.0–100.0)
Monocytes Absolute: 1.4 10*3/uL — ABNORMAL HIGH (ref 0.1–1.0)
Monocytes Relative: 8 % (ref 3–12)
Neutro Abs: 14.9 10*3/uL — ABNORMAL HIGH (ref 1.7–7.7)
Platelets: 406 10*3/uL — ABNORMAL HIGH (ref 150–400)
RDW: 13.5 % (ref 11.5–15.5)
WBC: 18.1 10*3/uL — ABNORMAL HIGH (ref 4.0–10.5)

## 2013-09-17 LAB — RAPID URINE DRUG SCREEN, HOSP PERFORMED
Barbiturates: NOT DETECTED
Benzodiazepines: NOT DETECTED
Tetrahydrocannabinol: POSITIVE — AB

## 2013-09-17 LAB — ETHANOL: Alcohol, Ethyl (B): <11 mg/dL (ref 0–11)

## 2013-09-17 SURGERY — OPEN REDUCTION INTERNAL FIXATION (ORIF) MANDIBULAR FRACTURE
Anesthesia: General | Site: Face

## 2013-09-17 MED ORDER — DEXAMETHASONE SODIUM PHOSPHATE 10 MG/ML IJ SOLN
INTRAMUSCULAR | Status: DC | PRN
  Administered 2013-09-17: 4 mg via INTRAVENOUS

## 2013-09-17 MED ORDER — OXYMETAZOLINE HCL 0.05 % NA SOLN
NASAL | Status: DC | PRN
  Administered 2013-09-17: 2 via NASAL

## 2013-09-17 MED ORDER — CLINDAMYCIN PALMITATE HCL 75 MG/5ML PO SOLR: 300.0000 mg | Freq: Three times a day (TID) | ORAL | Status: DC

## 2013-09-17 MED ORDER — LACTATED RINGERS IV SOLN
INTRAVENOUS | Status: DC | PRN
  Administered 2013-09-17 (×2): via INTRAVENOUS

## 2013-09-17 MED ORDER — LIDOCAINE-EPINEPHRINE 1 %-1:100000 IJ SOLN
INTRAMUSCULAR | Status: AC
  Filled 2013-09-17: qty 1

## 2013-09-17 MED ORDER — LIDOCAINE-EPINEPHRINE 1 %-1:100000 IJ SOLN
INTRAMUSCULAR | Status: DC | PRN
  Administered 2013-09-17: 2 mL

## 2013-09-17 MED ORDER — BACITRACIN ZINC 500 UNIT/GM EX OINT
TOPICAL_OINTMENT | CUTANEOUS | Status: AC
  Filled 2013-09-17: qty 15

## 2013-09-17 MED ORDER — HYDROCODONE-ACETAMINOPHEN 7.5-325 MG/15ML PO SOLN: 15.0000 mL | Freq: Four times a day (QID) | ORAL | Status: DC | PRN

## 2013-09-17 MED ORDER — PROPOFOL 10 MG/ML IV BOLUS
INTRAVENOUS | Status: DC | PRN
  Administered 2013-09-17: 180 mg via INTRAVENOUS

## 2013-09-17 MED ORDER — FENTANYL CITRATE 0.05 MG/ML IJ SOLN
INTRAMUSCULAR | Status: DC | PRN
  Administered 2013-09-17: 100 ug via INTRAVENOUS

## 2013-09-17 MED ORDER — SODIUM CHLORIDE 0.9 % IV SOLN
Freq: Once | INTRAVENOUS | Status: AC
  Administered 2013-09-17: 11:00:00 via INTRAVENOUS

## 2013-09-17 MED ORDER — HYDROMORPHONE HCL PF 1 MG/ML IJ SOLN: 0.2500 mg | INTRAMUSCULAR | Status: DC | PRN

## 2013-09-17 MED ORDER — TETANUS-DIPHTH-ACELL PERTUSSIS 5-2.5-18.5 LF-MCG/0.5 IM SUSP
0.5000 mL | Freq: Once | INTRAMUSCULAR | Status: AC
  Administered 2013-09-17: 0.5 mL via INTRAMUSCULAR
  Filled 2013-09-17: qty 0.5

## 2013-09-17 MED ORDER — CLINDAMYCIN PHOSPHATE 900 MG/50ML IV SOLN
900.0000 mg | Freq: Once | INTRAVENOUS | Status: AC
  Administered 2013-09-17: 900 mg via INTRAVENOUS
  Filled 2013-09-17 (×2): qty 50

## 2013-09-17 MED ORDER — CHLORHEXIDINE GLUCONATE 0.12 % MT SOLN
470.0000 mL | Freq: Once | OROMUCOSAL | Status: DC
  Filled 2013-09-17: qty 470

## 2013-09-17 MED ORDER — 0.9 % SODIUM CHLORIDE (POUR BTL) OPTIME
TOPICAL | Status: DC | PRN
  Administered 2013-09-17: 1000 mL

## 2013-09-17 MED ORDER — ONDANSETRON HCL 4 MG/2ML IJ SOLN
INTRAMUSCULAR | Status: DC | PRN
  Administered 2013-09-17: 4 mg via INTRAVENOUS

## 2013-09-17 MED ORDER — OXYMETAZOLINE HCL 0.05 % NA SOLN
NASAL | Status: AC
  Filled 2013-09-17: qty 15

## 2013-09-17 MED ORDER — SUCCINYLCHOLINE CHLORIDE 20 MG/ML IJ SOLN
INTRAMUSCULAR | Status: DC | PRN
  Administered 2013-09-17: 120 mg via INTRAVENOUS

## 2013-09-17 MED ORDER — CHLORHEXIDINE GLUCONATE 0.12 % MT SOLN: 15.0000 mL | Freq: Two times a day (BID) | OROMUCOSAL | Status: DC

## 2013-09-17 MED ORDER — LIDOCAINE HCL (CARDIAC) 20 MG/ML IV SOLN
INTRAVENOUS | Status: DC | PRN
  Administered 2013-09-17: 50 mg via INTRAVENOUS

## 2013-09-17 MED ORDER — ONDANSETRON HCL 4 MG/2ML IJ SOLN: 4.0000 mg | Freq: Once | INTRAMUSCULAR | Status: DC | PRN

## 2013-09-17 SURGICAL SUPPLY — 39 items
BAG DECANTER FOR FLEXI CONT (MISCELLANEOUS) ×1 IMPLANT
BIT DRILL 1.6X5 (BIT) ×1
BIT DRILL 1.6X5MM (BIT) IMPLANT
BIT DRILL TWIST 1.3X35MM (BIT) IMPLANT
BIT DRILL TWIST 1.6X58MM (BIT) IMPLANT
CANISTER SUCTION 2500CC (MISCELLANEOUS) ×2 IMPLANT
CLEANER TIP ELECTROSURG 2X2 (MISCELLANEOUS) ×2 IMPLANT
CLOTH BEACON ORANGE TIMEOUT ST (SAFETY) ×1 IMPLANT
DRILL BIT 1.6X5MM (BIT) ×2
DRILL TWIST 1.3X35MM (BIT) ×2
DRILL TWIST 1.6X58MM (BIT) ×2
ELECT COATED BLADE 2.86 ST (ELECTRODE) IMPLANT
ELECT NDL BLADE 2-5/6 (NEEDLE) IMPLANT
ELECT NEEDLE BLADE 2-5/6 (NEEDLE) IMPLANT
ELECT REM PT RETURN 9FT ADLT (ELECTROSURGICAL) ×2
ELECTRODE REM PT RTRN 9FT ADLT (ELECTROSURGICAL) ×1 IMPLANT
GLOVE ECLIPSE 7.5 STRL STRAW (GLOVE) ×2 IMPLANT
GOWN STRL NON-REIN LRG LVL3 (GOWN DISPOSABLE) ×4 IMPLANT
KIT BASIN OR (CUSTOM PROCEDURE TRAY) ×2 IMPLANT
KIT ROOM TURNOVER OR (KITS) ×2 IMPLANT
NS IRRIG 1000ML POUR BTL (IV SOLUTION) ×2 IMPLANT
PAD ARMBOARD 7.5X6 YLW CONV (MISCELLANEOUS) ×5 IMPLANT
PENCIL BUTTON HOLSTER BLD 10FT (ELECTRODE) ×2 IMPLANT
PLATE 4H FRACTURE (Plate) ×1 IMPLANT
PLATE MNDBLE MINI 4H (Plate) ×1 IMPLANT
SCISSORS WIRE ANG 4 3/4 DISP (INSTRUMENTS) ×1 IMPLANT
SCREW BONE CROSS PIN 2.0X10MM (Screw) ×2 IMPLANT
SCREW MNDBLE 2.0X10 LOCKING (Screw) ×2 IMPLANT
SCREW MNDBLE 2.0X4 BONE (Screw) ×8 IMPLANT
SCREW MNDBLE 2.0X8 LOCKING (Screw) ×2 IMPLANT
SCREW UPPER FACE 2.0X8MM (Screw) ×4 IMPLANT
SUT PLAIN 6 0 TG1408 (SUTURE) IMPLANT
SUT PROLENE 5 0 C1 (SUTURE) IMPLANT
SUT VIC AB 3-0 FS2 27 (SUTURE) IMPLANT
SUT VIC AB 4-0 PS2 27 (SUTURE) ×1 IMPLANT
TOWEL OR 17X24 6PK STRL BLUE (TOWEL DISPOSABLE) ×2 IMPLANT
TOWEL OR 17X26 10 PK STRL BLUE (TOWEL DISPOSABLE) ×2 IMPLANT
TRAY ENT MC OR (CUSTOM PROCEDURE TRAY) ×2 IMPLANT
WATER STERILE IRR 1000ML POUR (IV SOLUTION) ×1 IMPLANT

## 2013-09-17 NOTE — ED Notes (Addendum)
He is sleeping soundly and comfortably as I enter his room.  He arouses slowly and continues to follow all commands.  His family (several, including his mother) have seen our physician, and agree with plan to sent to [facility] to see ENT specialist.

## 2013-09-17 NOTE — Progress Notes (Signed)
Patient arrived from ED. Alert to painful stimuli. Patient placed on continuous pulse ox. Per ED RN this is unchanged from arrival. Will continue to monitor.

## 2013-09-17 NOTE — Brief Op Note (Signed)
09/17/2013  4:11 PM  PATIENT:  Rodney Chandler  27 y.o. male  PRE-OPERATIVE DIAGNOSIS:  mandible fracture  POST-OPERATIVE DIAGNOSIS:  mandible fracture  PROCEDURE:  Procedure(s): OPEN REDUCTION INTERNAL FIXATION (ORIF) MANDIBULAR FRACTURE (N/A) WITH INTERDENTAL FIXATION  SURGEON:  Surgeon(s) and Role:    * Sui W Kel Senn, MD - Primary  PHYSICIAN ASSISTANT:   ASSISTANTS: none   ANESTHESIA:   spinal  EBL:  Total I/O In: 1000 [I.V.:1000] Out: -   BLOOD ADMINISTERED:none  DRAINS: none   LOCAL MEDICATIONS USED:  LIDOCAINE   SPECIMEN:  No Specimen  DISPOSITION OF SPECIMEN:  N/A  COUNTS:  YES  TOURNIQUET:  * No tourniquets in log *  DICTATION: .Other Dictation: Dictation Number [id number]  PLAN OF CARE: Discharge to home after PACU  PATIENT DISPOSITION:  PACU - hemodynamically stable.   Delay start of Pharmacological VTE agent (>24hrs) due to surgical blood loss or risk of bleeding: not applicable

## 2013-09-17 NOTE — ED Notes (Signed)
[facility] is here for transport.  Pt. Remains in no distress.

## 2013-09-17 NOTE — Anesthesia Procedure Notes (Signed)
Procedure Name: Intubation Date/Time: 09/17/2013 2:49 PM Performed by: Brien Mates D Pre-anesthesia Checklist: Patient identified, Emergency Drugs available, Suction available, Patient being monitored and Timeout performed Patient Re-evaluated:Patient Re-evaluated prior to inductionOxygen Delivery Method: Circle system utilized Preoxygenation: Pre-oxygenation with 100% oxygen Intubation Type: IV induction Laryngoscope Size: Mac and 3 Grade View: Grade I Nasal Tubes: Right, Magill forceps- large, utilized, Nasal prep performed and Nasal Rae Tube size: 7.5 mm Number of attempts: 1 Airway Equipment and Method: Bougie stylet Placement Confirmation: ETT inserted through vocal cords under direct vision,  positive ETCO2 and breath sounds checked- equal and bilateral Tube secured with: Tape Dental Injury: Teeth and Oropharynx as per pre-operative assessment

## 2013-09-17 NOTE — Transfer of Care (Signed)
Immediate Anesthesia Transfer of Care Note  Patient: Rodney Chandler  Procedure(s) Performed: Procedure(s): OPEN REDUCTION INTERNAL FIXATION (ORIF) MANDIBULAR FRACTURE (N/A)  Patient Location: PACU  Anesthesia Type:General  Level of Consciousness: sedated  Airway & Oxygen Therapy: Patient Spontanous Breathing and Patient connected to face mask oxygen  Post-op Assessment: Report given to PACU RN and Post -op Vital signs reviewed and stable  Post vital signs: Reviewed and stable  Complications: No apparent anesthesia complications
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
## 2013-09-17 NOTE — ED Provider Notes (Signed)
Patient sent here in transfer [facility] accepted by Dr.Teoh patient is somnolent arousable to gentle tactile stimulus. Answers appropriately. HEENT exam there is swelling about the mandible with trismus and tenderness. Neck trachea midline lungs clear auscultation abdomen nontender nondistended neurologic somnolent arousable to gentle tactile stimulus Glasgow Coma Score 13 moves all extremities cranial nerves II through XII grossly intact.Dr [name] to evalaute pt in the ED  Doug Sou, MD 09/17/13 1258

## 2013-09-17 NOTE — Consult Note (Signed)
PULMONARY  / CRITICAL CARE MEDICINE  Name: Rodney Chandler MRN: [id number] DOB: Aug 31, 1986    ADMISSION DATE:  09/17/2013 CONSULTATION DATE:  12/6  REFERRING MD :  [name] Consult cancelled: PCCM service not needed.  [name] ACNP-BC [facility] Pulmonary/Critical Care Pager # [phone] OR # [phone] if no answer  i12/03/2013, 2:22 PM   [name], MD ; [facility] [phone].  After 5:30 PM or weekends, call [phone]
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
## 2013-09-17 NOTE — ED Provider Notes (Signed)
Medical screening examination/treatment/procedure(s) were performed by non-physician practitioner and as supervising physician I was immediately available for consultation/collaboration.  EKG Interpretation   None         Bellamarie Pflug M Camil Wilhelmsen, MD 09/17/13 0023 

## 2013-09-17 NOTE — ED Notes (Signed)
I have just phoned report to [place], Charity fundraiser at [employer].D.

## 2013-09-17 NOTE — ED Notes (Signed)
He remains in no distress and answers questions appropriately and calls several of his family/friends, who are visiting, by their correct name.
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
## 2013-09-17 NOTE — OR Nursing (Signed)
Chlorhexidine Gluconate oral rinse 0.125 sent to pacu with patient.

## 2013-09-17 NOTE — ED Notes (Signed)
Pt arrived via [facility]. No family at present time in [place]. Pt very lethargic. Did not wake up during transfer from stretcher. Pt arousable with light sternal rub and oriented x4. Pt sts is so tired because "I didn't get no rest"

## 2013-09-17 NOTE — ED Notes (Signed)
Patient used the urinal so an In and out cath was not need.

## 2013-09-17 NOTE — ED Notes (Signed)
Desiree Hane... Please call her when he is ready to be discharged. [phone]

## 2013-09-17 NOTE — Progress Notes (Signed)
Prior to signing consent patient was able to tell me name, DOB, year, location, president and planned procedure.

## 2013-09-17 NOTE — Anesthesia Postprocedure Evaluation (Signed)
  Anesthesia Post-op Note  Patient: Rodney Chandler  Procedure(s) Performed: Procedure(s): OPEN REDUCTION INTERNAL FIXATION (ORIF) MANDIBULAR FRACTURE (N/A)  Patient Location: PACU  Anesthesia Type:General  Level of Consciousness: awake, alert  and oriented  Airway and Oxygen Therapy: Patient Spontanous Breathing  Post-op Pain: mild  Post-op Assessment: Post-op Vital signs reviewed, Patient's Cardiovascular Status Stable, Respiratory Function Stable, Patent Airway, No signs of Nausea or vomiting and Pain level controlled  Post-op Vital Signs: stable  Complications: No apparent anesthesia complications
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
## 2013-09-20 ENCOUNTER — Encounter (HOSPITAL_COMMUNITY): Payer: Self-pay | Admitting: Otolaryngology

## 2013-09-23 ENCOUNTER — Emergency Department (HOSPITAL_COMMUNITY)
Admission: EM | Admit: 2013-09-23 | Discharge: 2013-09-23 | Disposition: A | Discharge status: Home / Self Care | Payer: Medicaid Other | Attending: Emergency Medicine | Admitting: Emergency Medicine

## 2013-09-23 ENCOUNTER — Encounter (HOSPITAL_COMMUNITY): Payer: Self-pay | Admitting: Emergency Medicine

## 2013-09-23 DIAGNOSIS — F309 Manic episode, unspecified: Secondary | ICD-10-CM | POA: Insufficient documentation

## 2013-09-23 DIAGNOSIS — Z79899 Other long term (current) drug therapy: Secondary | ICD-10-CM | POA: Insufficient documentation

## 2013-09-23 DIAGNOSIS — R6884 Jaw pain: Secondary | ICD-10-CM | POA: Insufficient documentation

## 2013-09-23 DIAGNOSIS — F209 Schizophrenia, unspecified: Secondary | ICD-10-CM | POA: Insufficient documentation

## 2013-09-23 DIAGNOSIS — G8918 Other acute postprocedural pain: Secondary | ICD-10-CM | POA: Insufficient documentation

## 2013-09-23 DIAGNOSIS — F17200 Nicotine dependence, unspecified, uncomplicated: Secondary | ICD-10-CM | POA: Insufficient documentation

## 2013-09-23 MED ORDER — IBUPROFEN 100 MG/5ML PO SUSP
600.0000 mg | Freq: Once | ORAL | Status: AC
  Administered 2013-09-23: 600 mg via ORAL
  Filled 2013-09-23: qty 30

## 2013-09-23 MED ORDER — IBUPROFEN 100 MG/5ML PO SUSP: 600.0000 mg | Freq: Three times a day (TID) | ORAL | Status: DC

## 2013-09-23 MED ORDER — HYDROCODONE-ACETAMINOPHEN 7.5-325 MG/15ML PO SOLN: 15.0000 mL | Freq: Four times a day (QID) | ORAL | Status: DC | PRN
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
## 2013-09-23 NOTE — ED Notes (Signed)
Bed: ZO10 Expected date:  Expected time:  Means of arrival:  Comments: EMS jaw pain, recent surgery
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
## 2013-09-23 NOTE — ED Provider Notes (Signed)
Medical screening examination/treatment/procedure(s) were performed by non-physician practitioner and as supervising physician I was immediately available for consultation/collaboration.   Sunnie Nielsen, MD 09/23/13 [phone]

## 2013-10-08 ENCOUNTER — Encounter (HOSPITAL_COMMUNITY): Payer: Self-pay | Admitting: Emergency Medicine

## 2013-10-08 ENCOUNTER — Emergency Department (HOSPITAL_COMMUNITY)
Admission: EM | Admit: 2013-10-08 | Discharge: 2013-10-09 | Disposition: A | Discharge status: Home / Self Care | Payer: Medicaid Other | Attending: Emergency Medicine | Admitting: Emergency Medicine

## 2013-10-08 DIAGNOSIS — Z8659 Personal history of other mental and behavioral disorders: Secondary | ICD-10-CM | POA: Insufficient documentation

## 2013-10-08 DIAGNOSIS — Z79899 Other long term (current) drug therapy: Secondary | ICD-10-CM | POA: Insufficient documentation

## 2013-10-08 DIAGNOSIS — R6884 Jaw pain: Secondary | ICD-10-CM | POA: Insufficient documentation

## 2013-10-08 DIAGNOSIS — G8911 Acute pain due to trauma: Secondary | ICD-10-CM | POA: Insufficient documentation

## 2013-10-08 DIAGNOSIS — F17200 Nicotine dependence, unspecified, uncomplicated: Secondary | ICD-10-CM | POA: Insufficient documentation

## 2013-10-08 MED ORDER — ENSURE COMPLETE PO LIQD
237.0000 mL | Freq: Two times a day (BID) | ORAL | Status: DC
  Filled 2013-10-08 (×2): qty 237

## 2013-10-08 NOTE — ED Notes (Signed)
Patient resting in position of comfort with eyes closed RR WNL--even and unlabored with equal rise and fall of chest Patient in NAD Side rails up, call bell in reach  

## 2013-10-08 NOTE — ED Notes (Signed)
Bed: WA23 Expected date:  Expected time:  Means of arrival:  Comments: EMS 
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
## 2013-10-09 NOTE — ED Notes (Signed)
Message sent to Pharmacy for Ensure to be sent to ED

## 2013-10-09 NOTE — ED Notes (Signed)
Patient refusing to leave or sign discharge papers Patient continues to demand that staff "cut or tighten the wires in my jaw." Patient again informed that he needs to make and keep f/u appointment, per discharge instructions Patient then started to become argumentative with nursing staff--ED security called to have patient removed

## 2013-10-09 NOTE — ED Provider Notes (Signed)
Medical screening examination/treatment/procedure(s) were performed by non-physician practitioner and as supervising physician I was immediately available for consultation/collaboration.  EKG Interpretation   None         Audree Camel, MD 10/09/13 1736

## 2013-10-09 NOTE — ED Notes (Signed)
Patient has refused to drink Ensure or other offered PO intake Patient has asked nursing staff for pain medication and/or to "cut the wires off my mouth" Patient appears in NAD Will discharge per PA order

## 2013-10-09 NOTE — ED Notes (Signed)
Patient refusing Ensure supplement Refusing other PO  Will make PA aware

## 2013-10-11 ENCOUNTER — Encounter (HOSPITAL_BASED_OUTPATIENT_CLINIC_OR_DEPARTMENT_OTHER): Payer: Self-pay | Admitting: *Deleted
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
## 2013-10-18 ENCOUNTER — Encounter (HOSPITAL_BASED_OUTPATIENT_CLINIC_OR_DEPARTMENT_OTHER): Payer: Medicaid Other | Admitting: Anesthesiology

## 2013-10-18 ENCOUNTER — Encounter (HOSPITAL_BASED_OUTPATIENT_CLINIC_OR_DEPARTMENT_OTHER)
Admission: RE | Disposition: A | Discharge status: Home / Self Care | Payer: Self-pay | Source: Ambulatory Visit | Attending: Otolaryngology

## 2013-10-18 ENCOUNTER — Ambulatory Visit (HOSPITAL_BASED_OUTPATIENT_CLINIC_OR_DEPARTMENT_OTHER): Payer: Medicaid Other | Admitting: Anesthesiology

## 2013-10-18 ENCOUNTER — Encounter (HOSPITAL_BASED_OUTPATIENT_CLINIC_OR_DEPARTMENT_OTHER): Payer: Self-pay | Admitting: *Deleted

## 2013-10-18 ENCOUNTER — Ambulatory Visit (HOSPITAL_BASED_OUTPATIENT_CLINIC_OR_DEPARTMENT_OTHER)
Admission: RE | Admit: 2013-10-18 | Discharge: 2013-10-18 | Disposition: A | Discharge status: Home / Self Care | Payer: Medicaid Other | Source: Ambulatory Visit | Attending: Otolaryngology | Admitting: Otolaryngology

## 2013-10-18 DIAGNOSIS — F3110 Bipolar disorder, current episode manic without psychotic features, unspecified: Secondary | ICD-10-CM | POA: Insufficient documentation

## 2013-10-18 DIAGNOSIS — Z472 Encounter for removal of internal fixation device: Secondary | ICD-10-CM | POA: Insufficient documentation

## 2013-10-18 DIAGNOSIS — F209 Schizophrenia, unspecified: Secondary | ICD-10-CM | POA: Insufficient documentation

## 2013-10-18 DIAGNOSIS — S02609B Fracture of mandible, unspecified, initial encounter for open fracture: Secondary | ICD-10-CM

## 2013-10-18 HISTORY — DX: Fracture of mandible, unspecified, initial encounter for closed fracture: S02.609A

## 2013-10-18 HISTORY — PX: MANDIBULAR HARDWARE REMOVAL: SHX5205

## 2013-10-18 LAB — POCT HEMOGLOBIN-HEMACUE: Hemoglobin: 13.2 g/dL (ref 13.0–17.0)

## 2013-10-18 SURGERY — REMOVAL, HARDWARE, MANDIBLE
Anesthesia: Monitor Anesthesia Care | Site: Mouth

## 2013-10-18 MED ORDER — IBUPROFEN 600 MG PO TABS: 600.0000 mg | ORAL_TABLET | Freq: Four times a day (QID) | ORAL | Status: DC | PRN

## 2013-10-18 MED ORDER — LIDOCAINE-EPINEPHRINE 1 %-1:100000 IJ SOLN
INTRAMUSCULAR | Status: DC | PRN
  Administered 2013-10-18: 1 mL

## 2013-10-18 MED ORDER — FENTANYL CITRATE 0.05 MG/ML IJ SOLN: 50.0000 ug | Freq: Once | INTRAMUSCULAR | Status: DC

## 2013-10-18 MED ORDER — BACITRACIN-NEOMYCIN-POLYMYXIN 400-5-5000 EX OINT
TOPICAL_OINTMENT | CUTANEOUS | Status: DC | PRN
  Administered 2013-10-18: 1 via TOPICAL

## 2013-10-18 MED ORDER — PROPOFOL 10 MG/ML IV EMUL
INTRAVENOUS | Status: AC
  Filled 2013-10-18: qty 50

## 2013-10-18 MED ORDER — PROMETHAZINE HCL 25 MG/ML IJ SOLN: 6.2500 mg | INTRAMUSCULAR | Status: DC | PRN

## 2013-10-18 MED ORDER — LACTATED RINGERS IV SOLN
INTRAVENOUS | Status: DC
  Administered 2013-10-18: 08:00:00 via INTRAVENOUS

## 2013-10-18 MED ORDER — FENTANYL CITRATE 0.05 MG/ML IJ SOLN
INTRAMUSCULAR | Status: DC | PRN
  Administered 2013-10-18: 50 ug via INTRAVENOUS

## 2013-10-18 MED ORDER — OXYCODONE HCL 5 MG PO TABS: 5.0000 mg | ORAL_TABLET | Freq: Once | ORAL | Status: DC | PRN

## 2013-10-18 MED ORDER — MIDAZOLAM HCL 2 MG/2ML IJ SOLN
INTRAMUSCULAR | Status: AC
  Filled 2013-10-18: qty 2

## 2013-10-18 MED ORDER — FENTANYL CITRATE 0.05 MG/ML IJ SOLN
INTRAMUSCULAR | Status: AC
  Filled 2013-10-18: qty 2

## 2013-10-18 MED ORDER — HYDROMORPHONE HCL PF 1 MG/ML IJ SOLN: 0.2500 mg | INTRAMUSCULAR | Status: DC | PRN

## 2013-10-18 MED ORDER — PROPOFOL INFUSION 10 MG/ML OPTIME
INTRAVENOUS | Status: DC | PRN
  Administered 2013-10-18: 50 ug/kg/min via INTRAVENOUS

## 2013-10-18 MED ORDER — OXYCODONE HCL 5 MG/5ML PO SOLN: 5.0000 mg | Freq: Once | ORAL | Status: DC | PRN

## 2013-10-18 MED ORDER — MIDAZOLAM HCL 5 MG/5ML IJ SOLN
INTRAMUSCULAR | Status: DC | PRN
  Administered 2013-10-18: 2 mg via INTRAVENOUS

## 2013-10-18 MED ORDER — MIDAZOLAM HCL 2 MG/2ML IJ SOLN: 1.0000 mg | INTRAMUSCULAR | Status: DC | PRN

## 2013-10-18 SURGICAL SUPPLY — 30 items
BLADE SURG 15 STRL LF DISP TIS (BLADE) ×1 IMPLANT
BLADE SURG 15 STRL SS (BLADE) ×3
CANISTER SUCT 1200ML W/VALVE (MISCELLANEOUS) ×3 IMPLANT
COVER MAYO STAND STRL (DRAPES) ×3 IMPLANT
DECANTER SPIKE VIAL GLASS SM (MISCELLANEOUS) ×3 IMPLANT
ELECT COATED BLADE 2.86 ST (ELECTRODE) ×3 IMPLANT
ELECT REM PT RETURN 9FT ADLT (ELECTROSURGICAL) ×3
ELECTRODE REM PT RTRN 9FT ADLT (ELECTROSURGICAL) ×1 IMPLANT
GAUZE SPONGE 4X4 16PLY XRAY LF (GAUZE/BANDAGES/DRESSINGS) IMPLANT
GLOVE BIO SURGEON STRL SZ7.5 (GLOVE) ×3 IMPLANT
GLOVE SURG SS PI 7.0 STRL IVOR (GLOVE) ×2 IMPLANT
GOWN STRL REUS W/ TWL LRG LVL3 (GOWN DISPOSABLE) ×2 IMPLANT
GOWN STRL REUS W/TWL LRG LVL3 (GOWN DISPOSABLE) ×6
MARKER SKIN DUAL TIP RULER LAB (MISCELLANEOUS) IMPLANT
NEEDLE 27GAX1X1/2 (NEEDLE) ×3 IMPLANT
NS IRRIG 1000ML POUR BTL (IV SOLUTION) ×3 IMPLANT
PACK BASIN DAY SURGERY FS (CUSTOM PROCEDURE TRAY) ×3 IMPLANT
PENCIL BUTTON HOLSTER BLD 10FT (ELECTRODE) ×3 IMPLANT
SCISSORS WIRE ANG 4 3/4 DISP (INSTRUMENTS) IMPLANT
SHEET MEDIUM DRAPE 40X70 STRL (DRAPES) ×3 IMPLANT
SPONGE GAUZE 4X4 12PLY STER LF (GAUZE/BANDAGES/DRESSINGS) ×6 IMPLANT
SUT CHROMIC 3 0 PS 2 (SUTURE) IMPLANT
SUT CHROMIC 4 0 PS 2 18 (SUTURE) IMPLANT
SUT CHROMIC 4 0 RB 1X27 (SUTURE) IMPLANT
SYR CONTROL 10ML LL (SYRINGE) ×3 IMPLANT
TOWEL OR 17X24 6PK STRL BLUE (TOWEL DISPOSABLE) ×3 IMPLANT
TRAY DSU PREP LF (CUSTOM PROCEDURE TRAY) IMPLANT
TUBE CONNECTING 20'X1/4 (TUBING) ×1
TUBE CONNECTING 20X1/4 (TUBING) ×2 IMPLANT
YANKAUER SUCT BULB TIP NO VENT (SUCTIONS) IMPLANT

## 2013-10-18 NOTE — Transfer of Care (Signed)
Immediate Anesthesia Transfer of Care Note  Patient: Rodney HummerDonald J Chandler  Procedure(s) Performed: Procedure(s): MANDIBULAR HARDWARE REMOVAL (N/A)  Patient Location: PACU  Anesthesia Type:MAC  Level of Consciousness: awake, alert  and oriented  Airway & Oxygen Therapy: Patient Spontanous Breathing and Patient connected to nasal cannula oxygen  Post-op Assessment: Report given to PACU RN and Post -op Vital signs reviewed and stable  Post vital signs: Reviewed and stable  Complications: No apparent anesthesia complications

## 2013-10-18 NOTE — Discharge Instructions (Addendum)
Pt may resume regular soft diet, advance to regular as tolerated.  Call your surgeon if you experience:   1.  Fever over 101.0. 2.  Inability to urinate. 3.  Nausea and/or vomiting. 4.  Extreme swelling or bruising at the surgical site. 5.  Continued bleeding from the incision. 6.  Increased pain, redness or drainage from the incision. 7.  Problems related to your pain medication.   Post Anesthesia Home Care Instructions  Activity: Get plenty of rest for the remainder of the day. A responsible adult should stay with you for 24 hours following the procedure.  For the next 24 hours, DO NOT: -Drive a car -Advertising copywriterperate machinery -Drink alcoholic beverages -Take any medication unless instructed by your physician -Make any legal decisions or sign important papers.  Meals: Start with liquid foods such as gelatin or soup. Progress to regular foods as tolerated. Avoid greasy, spicy, heavy foods. If nausea and/or vomiting occur, drink only clear liquids until the nausea and/or vomiting subsides. Call your physician if vomiting continues.  Special Instructions/Symptoms: Your throat may feel dry or sore from the anesthesia or the breathing tube placed in your throat during surgery. If this causes discomfort, gargle with warm salt water. The discomfort should disappear within 24 hours.
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
## 2013-10-18 NOTE — Brief Op Note (Signed)
10/18/2013  9:50 AM  PATIENT:  Westley Hummeronald J Churilla  28 y.o. male  PRE-OPERATIVE DIAGNOSIS:  Mandibular Fractures  POST-OPERATIVE DIAGNOSIS:  Mandibular Fractures  PROCEDURE:  Procedure(s): MANDIBULAR HARDWARE REMOVAL (N/A)  SURGEON:  Surgeon(s) and Role:    * Darletta MollSui W Gessica Jawad, MD - Primary  PHYSICIAN ASSISTANT:   ASSISTANTS: none   ANESTHESIA:   MAC  EBL:  Total I/O In: 200 [Blood:200] Out: -   BLOOD ADMINISTERED:none  DRAINS: none   LOCAL MEDICATIONS USED:  LIDOCAINE   SPECIMEN:  No Specimen  DISPOSITION OF SPECIMEN:  N/A  COUNTS:  YES  TOURNIQUET:  * No tourniquets in log *  DICTATION: .Other Dictation: Dictation Number [id number]  PLAN OF CARE: Discharge to home after PACU  PATIENT DISPOSITION:  PACU - hemodynamically stable.   Delay start of Pharmacological VTE agent (>24hrs) due to surgical blood loss or risk of bleeding: not applicable

## 2013-10-18 NOTE — Anesthesia Postprocedure Evaluation (Signed)
  Anesthesia Post-op Note  Patient: Rodney Chandler  Procedure(s) Performed: Procedure(s): MANDIBULAR HARDWARE REMOVAL (N/A)  Patient Location: PACU  Anesthesia Type:MAC  Level of Consciousness: awake and alert   Airway and Oxygen Therapy: Patient Spontanous Breathing  Post-op Pain: mild  Post-op Assessment: Post-op Vital signs reviewed, Patient's Cardiovascular Status Stable, Respiratory Function Stable, Patent Airway, No signs of Nausea or vomiting and Pain level controlled  Post-op Vital Signs: Reviewed and stable  Complications: No apparent anesthesia complications

## 2013-10-18 NOTE — Anesthesia Preprocedure Evaluation (Addendum)
Anesthesia Evaluation  Patient identified by MRN, date of birth, ID band Patient awake    Reviewed: Allergy & Precautions, H&P , NPO status , Patient's Chart, lab work & pertinent test results  Airway  TM Distance: >3 FB Neck ROM: Full  Mouth opening: Limited Mouth Opening  Dental   Pulmonary COPDCurrent Smoker,  + rhonchi         Cardiovascular Rhythm:Regular Rate:Normal     Neuro/Psych Bipolar Disorder Schizophrenia    GI/Hepatic (+)     substance abuse  marijuana use,   Endo/Other    Renal/GU      Musculoskeletal   Abdominal   Peds  Hematology   Anesthesia Other Findings   Reproductive/Obstetrics                          Anesthesia Physical Anesthesia Plan  ASA: III  Anesthesia Plan: MAC   Post-op Pain Management:    Induction: Intravenous  Airway Management Planned: Nasal Cannula  Additional Equipment:   Intra-op Plan:   Post-operative Plan:   Informed Consent: I have reviewed the patients History and Physical, chart, labs and discussed the procedure including the risks, benefits and alternatives for the proposed anesthesia with the patient or authorized representative who has indicated his/her understanding and acceptance.     Plan Discussed with: CRNA and Surgeon  Anesthesia Plan Comments:         Anesthesia Quick Evaluation

## 2013-10-19 ENCOUNTER — Encounter (HOSPITAL_BASED_OUTPATIENT_CLINIC_OR_DEPARTMENT_OTHER): Payer: Self-pay | Admitting: Otolaryngology
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
## 2013-10-31 ENCOUNTER — Emergency Department (HOSPITAL_COMMUNITY)
Admission: EM | Admit: 2013-10-31 | Discharge: 2013-11-02 | Disposition: A | Discharge status: Home / Self Care | Payer: Medicaid Other | Attending: Emergency Medicine | Admitting: Emergency Medicine

## 2013-10-31 ENCOUNTER — Encounter (HOSPITAL_COMMUNITY): Payer: Self-pay | Admitting: Emergency Medicine

## 2013-10-31 DIAGNOSIS — F209 Schizophrenia, unspecified: Secondary | ICD-10-CM | POA: Insufficient documentation

## 2013-10-31 DIAGNOSIS — F319 Bipolar disorder, unspecified: Secondary | ICD-10-CM | POA: Insufficient documentation

## 2013-10-31 DIAGNOSIS — F17200 Nicotine dependence, unspecified, uncomplicated: Secondary | ICD-10-CM | POA: Insufficient documentation

## 2013-10-31 DIAGNOSIS — Z79899 Other long term (current) drug therapy: Secondary | ICD-10-CM | POA: Insufficient documentation

## 2013-10-31 DIAGNOSIS — Z8781 Personal history of (healed) traumatic fracture: Secondary | ICD-10-CM | POA: Insufficient documentation

## 2013-10-31 LAB — RAPID URINE DRUG SCREEN, HOSP PERFORMED
AMPHETAMINES: NOT DETECTED
BENZODIAZEPINES: NOT DETECTED
Barbiturates: NOT DETECTED
COCAINE: NOT DETECTED
OPIATES: NOT DETECTED
TETRAHYDROCANNABINOL: NOT DETECTED

## 2013-10-31 LAB — COMPREHENSIVE METABOLIC PANEL
ALT: 38 U/L (ref 0–53)
AST: 43 U/L — ABNORMAL HIGH (ref 0–37)
Albumin: 4.3 g/dL (ref 3.5–5.2)
Alkaline Phosphatase: 67 U/L (ref 39–117)
BUN: 13 mg/dL (ref 6–23)
CO2: 29 mEq/L (ref 19–32)
Calcium: 9.3 mg/dL (ref 8.4–10.5)
Chloride: 102 mEq/L (ref 96–112)
Creatinine, Ser: 0.82 mg/dL (ref 0.50–1.35)
GFR calc Af Amer: >90 mL/min (ref >90)
GFR calc non Af Amer: >90 mL/min (ref >90)
GLUCOSE: 85 mg/dL (ref 70–99)
Potassium: 4.1 mEq/L (ref 3.7–5.3)
SODIUM: 140 meq/L (ref 137–147)
Total Bilirubin: 0.3 mg/dL (ref 0.3–1.2)
Total Protein: 7.6 g/dL (ref 6.0–8.3)

## 2013-10-31 LAB — ACETAMINOPHEN LEVEL: Acetaminophen (Tylenol), Serum: <15 ug/mL (ref 10–30)

## 2013-10-31 LAB — CBC
HCT: 38.3 % — ABNORMAL LOW (ref 39.0–52.0)
HEMOGLOBIN: 13.2 g/dL (ref 13.0–17.0)
MCH: 32.7 pg (ref 26.0–34.0)
MCHC: 34.5 g/dL (ref 30.0–36.0)
MCV: 94.8 fL (ref 78.0–100.0)
PLATELETS: 260 10*3/uL (ref 150–400)
RBC: 4.04 MIL/uL — ABNORMAL LOW (ref 4.22–5.81)
RDW: 12.6 % (ref 11.5–15.5)
WBC: 14.4 10*3/uL — ABNORMAL HIGH (ref 4.0–10.5)

## 2013-10-31 LAB — SALICYLATE LEVEL: Salicylate Lvl: <2 mg/dL — ABNORMAL LOW (ref 2.8–20.0)

## 2013-10-31 LAB — ETHANOL: Alcohol, Ethyl (B): <11 mg/dL (ref 0–11)

## 2013-10-31 MED ORDER — LORAZEPAM 2 MG/ML IJ SOLN
2.0000 mg | Freq: Once | INTRAMUSCULAR | Status: AC
  Administered 2013-10-31: 2 mg via INTRAMUSCULAR
  Filled 2013-10-31: qty 1

## 2013-10-31 MED ORDER — ALUM & MAG HYDROXIDE-SIMETH 200-200-20 MG/5ML PO SUSP: 30.0000 mL | ORAL | Status: DC | PRN

## 2013-10-31 MED ORDER — NICOTINE 21 MG/24HR TD PT24: 21.0000 mg | MEDICATED_PATCH | Freq: Every day | TRANSDERMAL | Status: DC

## 2013-10-31 MED ORDER — ONDANSETRON HCL 4 MG PO TABS
4.0000 mg | ORAL_TABLET | Freq: Three times a day (TID) | ORAL | Status: DC | PRN
Start: 2013-10-31 — End: 2013-11-02

## 2013-10-31 MED ORDER — DIPHENHYDRAMINE HCL 50 MG/ML IJ SOLN
50.0000 mg | Freq: Once | INTRAMUSCULAR | Status: AC
  Administered 2013-10-31: 50 mg via INTRAMUSCULAR
  Filled 2013-10-31: qty 1

## 2013-10-31 MED ORDER — LORAZEPAM 1 MG PO TABS
1.0000 mg | ORAL_TABLET | Freq: Three times a day (TID) | ORAL | Status: DC | PRN
  Administered 2013-10-31 – 2013-11-02 (×4): 1 mg via ORAL
  Filled 2013-10-31 (×4): qty 1

## 2013-10-31 MED ORDER — ACETAMINOPHEN 325 MG PO TABS
650.0000 mg | ORAL_TABLET | ORAL | Status: DC | PRN
  Administered 2013-11-02: 650 mg via ORAL
  Filled 2013-10-31: qty 2

## 2013-10-31 MED ORDER — IBUPROFEN 200 MG PO TABS
600.0000 mg | ORAL_TABLET | Freq: Three times a day (TID) | ORAL | Status: DC | PRN
  Administered 2013-11-02: 600 mg via ORAL
  Filled 2013-10-31: qty 3

## 2013-10-31 MED ORDER — HALOPERIDOL LACTATE 5 MG/ML IJ SOLN
5.0000 mg | Freq: Once | INTRAMUSCULAR | Status: AC
  Administered 2013-10-31: 5 mg via INTRAMUSCULAR
  Filled 2013-10-31: qty 1

## 2013-10-31 MED ORDER — ZOLPIDEM TARTRATE 5 MG PO TABS
5.0000 mg | ORAL_TABLET | Freq: Every evening | ORAL | Status: DC | PRN
  Administered 2013-11-01: 5 mg via ORAL
  Filled 2013-10-31: qty 1
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
## 2013-10-31 NOTE — ED Notes (Signed)
Patient belongings waunded and moved to locker 30
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
## 2013-10-31 NOTE — BH Assessment (Signed)
B[facility] Assessment Progress Note   Patient was too volatile to be assessed immediately.  Patient has been given sedatives to assist with mood.  At this time patient cannot be assessed.  Clinician did attempt to call mother & grandmother but was unsuccessful.
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
## 2013-11-01 ENCOUNTER — Encounter (HOSPITAL_COMMUNITY): Payer: Self-pay | Admitting: Psychiatry

## 2013-11-01 DIAGNOSIS — F209 Schizophrenia, unspecified: Secondary | ICD-10-CM

## 2013-11-01 MED ORDER — BENZTROPINE MESYLATE 1 MG PO TABS
1.0000 mg | ORAL_TABLET | Freq: Every day | ORAL | Status: DC
  Administered 2013-11-01 – 2013-11-02 (×2): 1 mg via ORAL
  Filled 2013-11-01: qty 1

## 2013-11-01 MED ORDER — HALOPERIDOL 5 MG PO TABS
5.0000 mg | ORAL_TABLET | Freq: Every day | ORAL | Status: DC
  Administered 2013-11-01 – 2013-11-02 (×2): 5 mg via ORAL
  Filled 2013-11-01 (×2): qty 1

## 2013-11-01 MED ORDER — DIPHENHYDRAMINE HCL 50 MG/ML IJ SOLN
25.0000 mg | Freq: Once | INTRAMUSCULAR | Status: AC
Start: 2013-11-01 — End: 2013-11-01
  Administered 2013-11-01: 25 mg via INTRAVENOUS
  Filled 2013-11-01: qty 1

## 2013-11-01 MED ORDER — LORAZEPAM 2 MG/ML IJ SOLN
INTRAMUSCULAR | Status: AC
  Administered 2013-11-01: 13:00:00
  Filled 2013-11-01: qty 1

## 2013-11-01 MED ORDER — ZIPRASIDONE MESYLATE 20 MG IM SOLR
20.0000 mg | Freq: Once | INTRAMUSCULAR | Status: AC
Start: 2013-11-01 — End: 2013-11-01
  Administered 2013-11-01: 20 mg via INTRAMUSCULAR
  Filled 2013-11-01: qty 20

## 2013-11-01 NOTE — ED Notes (Signed)
Pt aaox3.  Pt laying on bed in activity room.  When asked about pt day, pt states "I am doing fine since i see yous sexy a--."  Pt advised that statement was inappropriate.  Pt calm and cooperative at this time.  Pt reports., "I just want to watch the news and see what's going on."

## 2013-11-01 NOTE — ED Provider Notes (Signed)
Medical screening examination/treatment/procedure(s) were performed by non-physician practitioner and as supervising physician I was immediately available for consultation/collaboration.  EKG Interpretation   None        Shantel Helwig K Linker, MD 11/01/13 1722 

## 2013-11-01 NOTE — BH Assessment (Signed)
Per Nanine MeansJamison Lord, NP. Disposition pending collateral information from patient's mother-Edna Ewing #[phone]. Mother called at [phone] and voicemail left.

## 2013-11-01 NOTE — Consult Note (Signed)
Review of Systems  Constitutional: Negative.   HENT: Negative.   Eyes: Negative.   Respiratory: Negative.   Cardiovascular: Negative.   Gastrointestinal: Negative.   Genitourinary: Negative.   Musculoskeletal: Negative.   Skin: Negative.   Neurological: Negative.   Endo/Heme/Allergies: Negative.   Psychiatric/Behavioral: The patient is nervous/anxious.        Hypomanic symptoms

## 2013-11-01 NOTE — ED Notes (Signed)
Pt. Continues to demand to use phone, wants to call his Aunt and his lawyer.  Attempted to redirect pt. Back to his room, not successful.  Pt. In front of nurses station window, calling this Clinical research associatewriter, you damn M.F., Notified EDP of pt.'s behavior, requesting meds for pt.

## 2013-11-01 NOTE — ED Notes (Signed)
Pt. Sitting in up in bed, eating his dinner.

## 2013-11-01 NOTE — ED Notes (Signed)
Pt awake, began to yell out in his room. Pt walked to the bathroom and passed the nursing station and postured at this RN as he walked by and then held up his middle finger. Pt labile and agitated. Pt eventually walked back to his room and went to bed. No distress noted.

## 2013-11-01 NOTE — ED Notes (Signed)
Pt. In hall, loud, asking to use phone repeatly, attemping to redirect pt. To room.  Pt.'s Mother came back to see him, stayed in his room for a few minutes, pt. Became loud, Mother asking to leave.  Pt. Stated to a MHT to not let this writer come near him.  Pt. Went to his room, covered his head and layed  Down in his bed.

## 2013-11-01 NOTE — Consult Note (Signed)
[facility] Consult   Reason for Consult:  Mania  Referring Physician:  ED MD Rodney Chandler is an 28 y.o. male.  Assessment: AXIS I:  Schizoaffective Disorder AXIS II:  Deferred AXIS III:   Past Medical History  Diagnosis Date  . Bipolar 1 disorder   . Manic depression   . Schizophrenia   . Mandibular fracture 09/17/2013   AXIS IV:  other psychosocial or environmental problems, problems related to social environment and problems with primary support group AXIS V:  41-50 serious symptoms  Plan:  Recommend psychiatric Inpatient admission when medically cleared.  Unless he stabilizes, then discharge home.  Subjective:   Rodney Chandler is a 28 y.o. male patient to be revaluated after speaking with his mother.  HPI:  Patient arrived in the ED last night, highly agitated.  He claims it was because they were forcing meds.  Not agitated on assessment but restless and hyperactive, pleasant.  He requests to leave since he just needed his medications, he ran out--patient at [facility].  Cooperative with his medications and care at this time. HPI Elements:   Location:  generalized. Quality:  acute. Severity:  severe. Timing:  constant, past week. Duration:  past week. Context:  stressors.  Past Psychiatric History: Past Medical History  Diagnosis Date  . Bipolar 1 disorder   . Manic depression   . Schizophrenia   . Mandibular fracture 09/17/2013    reports that he has been smoking.  He has never used smokeless tobacco. He reports that he drinks alcohol. He reports that he uses illicit drugs (Marijuana). History reviewed. No pertinent family history.         Allergies:   Allergies  Allergen Reactions  . Lithium Other (See Comments)    NOSEBLEED  . Depakote [Divalproex Sodium] Other (See Comments)    NOSEBLEED  . Zyprexa [Olanzapine] Other (See Comments)    NOSEBLEED    ACT Assessment Complete: Yes Objective: Blood pressure 115/88, pulse 115, temperature  97.4 F (36.3 C), temperature source Oral, resp. rate 20, SpO2 98.00%.There is no weight on file to calculate BMI. Results for orders placed during the hospital encounter of 10/31/13 (from the past 72 hour(s))  CBC     Status: Abnormal   Collection Time    10/31/13  4:11 PM      Result Value Range   WBC 14.4 (*) 4.0 - 10.5 K/uL   RBC 4.04 (*) 4.22 - 5.81 MIL/uL   Hemoglobin 13.2  13.0 - 17.0 g/dL   HCT 38.3 (*) 39.0 - 52.0 %   MCV 94.8  78.0 - 100.0 fL   MCH 32.7  26.0 - 34.0 pg   MCHC 34.5  30.0 - 36.0 g/dL   RDW 12.6  11.5 - 15.5 %   Platelets 260  150 - 400 K/uL  COMPREHENSIVE METABOLIC PANEL     Status: Abnormal   Collection Time    10/31/13  4:11 PM      Result Value Range   Sodium 140  137 - 147 mEq/L   Potassium 4.1  3.7 - 5.3 mEq/L   Chloride 102  96 - 112 mEq/L   CO2 29  19 - 32 mEq/L   Glucose, Bld 85  70 - 99 mg/dL   BUN 13  6 - 23 mg/dL   Creatinine, Ser 0.82  0.50 - 1.35 mg/dL   Calcium 9.3  8.4 - 10.5 mg/dL   Total Protein 7.6  6.0 - 8.3 g/dL  Albumin 4.3  3.5 - 5.2 g/dL   AST 43 (*) 0 - 37 U/L   ALT 38  0 - 53 U/L   Alkaline Phosphatase 67  39 - 117 U/L   Total Bilirubin 0.3  0.3 - 1.2 mg/dL   GFR calc non Af Amer >90  >90 mL/min   GFR calc Af Amer >90  >90 mL/min   Comment: (NOTE)     The eGFR has been calculated using the CKD EPI equation.     This calculation has not been validated in all clinical situations.     eGFR's persistently <90 mL/min signify possible Chronic Kidney     Disease.  ETHANOL     Status: None   Collection Time    10/31/13  4:11 PM      Result Value Range   Alcohol, Ethyl (B) <11  0 - 11 mg/dL   Comment:            LOWEST DETECTABLE LIMIT FOR     SERUM ALCOHOL IS 11 mg/dL     FOR MEDICAL PURPOSES ONLY  ACETAMINOPHEN LEVEL     Status: None   Collection Time    10/31/13  4:11 PM      Result Value Range   Acetaminophen (Tylenol), Serum <15.0  10 - 30 ug/mL   Comment:            THERAPEUTIC CONCENTRATIONS VARY      SIGNIFICANTLY. A RANGE OF 10-30     ug/mL MAY BE AN EFFECTIVE     CONCENTRATION FOR MANY PATIENTS.     HOWEVER, SOME ARE BEST TREATED     AT CONCENTRATIONS OUTSIDE THIS     RANGE.     ACETAMINOPHEN CONCENTRATIONS     >150 ug/mL AT 4 HOURS AFTER     INGESTION AND >50 ug/mL AT 12     HOURS AFTER INGESTION ARE     OFTEN ASSOCIATED WITH TOXIC     REACTIONS.  SALICYLATE LEVEL     Status: Abnormal   Collection Time    10/31/13  4:11 PM      Result Value Range   Salicylate Lvl <2.0 (*) 2.8 - 20.0 mg/dL  URINE RAPID DRUG SCREEN (HOSP PERFORMED)     Status: None   Collection Time    10/31/13  4:46 PM      Result Value Range   Opiates NONE DETECTED  NONE DETECTED   Cocaine NONE DETECTED  NONE DETECTED   Benzodiazepines NONE DETECTED  NONE DETECTED   Amphetamines NONE DETECTED  NONE DETECTED   Tetrahydrocannabinol NONE DETECTED  NONE DETECTED   Barbiturates NONE DETECTED  NONE DETECTED   Comment:            DRUG SCREEN FOR MEDICAL PURPOSES     ONLY.  IF CONFIRMATION IS NEEDED     FOR ANY PURPOSE, NOTIFY LAB     WITHIN 5 DAYS.                LOWEST DETECTABLE LIMITS     FOR URINE DRUG SCREEN     Drug Class       Cutoff (ng/mL)     Amphetamine      1000     Barbiturate      200     Benzodiazepine   200     Tricyclics       300     Opiates          300  Cocaine          300     THC              50   Labs are reviewed and are pertinent for no medical issues.  Current Facility-Administered Medications  Medication Dose Route Frequency Provider Last Rate Last Dose  . acetaminophen (TYLENOL) tablet 650 mg  650 mg Oral Q4H PRN Clayton Bibles, PA-C      . alum & mag hydroxide-simeth (MAALOX/MYLANTA) 200-200-20 MG/5ML suspension 30 mL  30 mL Oral PRN Clayton Bibles, PA-C      . benztropine (COGENTIN) tablet 1 mg  1 mg Oral Daily Wandra Arthurs, MD   1 mg at 11/01/13 1126  . haloperidol (HALDOL) tablet 5 mg  5 mg Oral Daily Wandra Arthurs, MD   5 mg at 11/01/13 1123  . ibuprofen (ADVIL,MOTRIN)  tablet 600 mg  600 mg Oral Q8H PRN Clayton Bibles, PA-C      . LORazepam (ATIVAN) tablet 1 mg  1 mg Oral Q8H PRN Clayton Bibles, PA-C   1 mg at 11/01/13 1220  . nicotine (NICODERM CQ - dosed in mg/24 hours) patch 21 mg  21 mg Transdermal Daily Emily West, PA-C      . ondansetron [facility]) tablet 4 mg  4 mg Oral Q8H PRN Clayton Bibles, PA-C      . zolpidem (AMBIEN) tablet 5 mg  5 mg Oral QHS PRN Clayton Bibles, PA-C       Current Outpatient Prescriptions  Medication Sig Dispense Refill  . acetaminophen (TYLENOL) 325 MG tablet Take 650 mg by mouth every 6 (six) hours as needed (pain).      . benztropine (COGENTIN) 1 MG tablet Take 1 tablet (1 mg total) by mouth daily.  30 tablet  0  . haloperidol (HALDOL) 5 MG tablet Take 1 tablet (5 mg total) by mouth daily.  30 tablet  0    Psychiatric Specialty Exam:     Blood pressure 115/88, pulse 115, temperature 97.4 F (36.3 C), temperature source Oral, resp. rate 20, SpO2 98.00%.There is no weight on file to calculate BMI.  General Appearance: Casual  Eye Contact::  Fair  Speech:  Pressured  Volume:  Normal  Mood:  Euphoric  Affect:  Congruent  Thought Process:  Irrelevant  Orientation:  Full (Time, Place, and Person)  Thought Content:  WDL  Suicidal Thoughts:  No  Homicidal Thoughts:  No  Memory:  Immediate;   Fair Recent;   Fair Remote;   Fair  Judgement:  Poor  Insight:  Lacking  Psychomotor Activity:  Increased  Concentration:  Fair  Recall:  Fair  Akathisia:  No  Handed:  Right  AIMS (if indicated):     Assets:  Leisure Time Physical Health Resilience  Sleep:      Treatment Plan Summary: Daily contact with patient to assess and evaluate symptoms and progress in treatment Medication management Placement pending talking with his mother.  Waylan Boga, PMH-NP 11/01/2013 1:58 PM

## 2013-11-01 NOTE — ED Notes (Signed)
Pt was given sedatives to calm his behavior down earlier, when pt. Is awake, will give him schedules medications.

## 2013-11-01 NOTE — ED Notes (Signed)
Pt remains asleep; no distress noted. Respirations regular and unlabored.

## 2013-11-02 MED ORDER — HALOPERIDOL 5 MG PO TABS: 5.0000 mg | ORAL_TABLET | Freq: Two times a day (BID) | ORAL | Status: DC

## 2013-11-02 MED ORDER — BENZTROPINE MESYLATE 1 MG PO TABS: 1.0000 mg | ORAL_TABLET | Freq: Every day | ORAL | Status: DC

## 2013-11-02 MED ORDER — TRAZODONE HCL 100 MG PO TABS: 100.0000 mg | ORAL_TABLET | Freq: Every day | ORAL | Status: DC

## 2013-11-02 NOTE — ED Notes (Signed)
Patient has been up since 0005. Patient reports that's he wants go home. Patient has been up all night singing and dancing.

## 2013-11-02 NOTE — Consult Note (Signed)
  Psychiatric Specialty Exam: Physical Exam  ROS  Blood pressure 134/79, pulse 106, temperature 97.5 F (36.4 C), temperature source Oral, resp. rate 17, SpO2 100.00%.There is no weight on file to calculate BMI.  General Appearance: Casual  Eye Contact::  Good  Speech:  Clear and Coherent and talks a lot and rapidly  Volume:  Normal  Mood:  Euthymic  Affect:  Appropriate and Congruent  Thought Process:  Logical and logical but raps and talks a lot  Orientation:  Full (Time, Place, and Person)  Thought Content:  Negative  Suicidal Thoughts:  No  Homicidal Thoughts:  No  Memory:  Immediate;   Good Recent;   Good Remote;   Good  Judgement:  Fair  Insight:  Lacking  Psychomotor Activity:  Normal  Concentration:  Fair  Recall:  Good  Akathisia:  Negative  Handed:  Right  AIMS (if indicated):     Assets:  Communication Skills Physical Health Resilience  Sleep:   poor    Rodney Chandler is still hyper/mildly manic but is talking logically, denying suicidal ideation or threats to anyone else.  He wants to go home and is impatient to leave.  The plan is to have his ACTT meet with him before he leaves as they have a hard time finding him ordinarily.  Discharge home today.  Diagnosis is Schizoaffective disorder, manic type

## 2013-11-02 NOTE — ED Notes (Signed)
Patient rapping and beating on walls. 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 Patient asked to stop rapping loud and banging and on walls so that other patient can sleep. Patient verbalized understanding. Encouragement and support provided and safety maintain.

## 2013-11-02 NOTE — BHH Counselor (Addendum)
11:20 am Writer and Dr. Ladona Ridgelaylor informed by RN Minerva AreolaEric that pt is ready to leave and won't wait any longer. Writer called Dian QueenKevin ACTT who states he will instead come and visit pt [place] [address] at 10 am tomorrow. Writer spoke w/ pt who is agreeable to [place] visiting 1/22 at 10 am.   Evette Cristalaroline Paige Geovani Tootle, [place] Assessment Counselor   Dr. Ladona Ridgelaylor rescinded IVC. Writer placed copy in pt's chart and originals in IVC NB in nurses' station. Writer called and spoke w/ Dian QueenKevin, [facility] at [facility]. Kevin's cell [phone]. [facility] pt and finish assessing him for [facility]. At this point, pt isn't completely signed up with [facility] as [facility] had hard time locating him.   Evette Cristalaroline Paige Esiquio Boesen, [place] Assessment Counselor
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
## 2013-11-02 NOTE — B[facility] Suicide Risk Assessment (Signed)
Suicide Risk Assessment  Discharge Assessment     Demographic Factors:  Male, Adolescent or young adult, Low socioeconomic status and Unemployed  Mental Status Per Nursing Assessment::   On Admission:     Current Mental Status by Physician: NA  Loss Factors: NA  Historical Factors: NA  Risk Reduction Factors:   Responsible for children under 27 years of age  Continued Clinical Symptoms:  Schizophrenia:   Less than 27 years old  Cognitive Features That Contribute To Risk:  Closed-mindedness    Suicide Risk:  Minimal: No identifiable suicidal ideation.  Patients presenting with no risk factors but with morbid ruminations; may be classified as minimal risk based on the severity of the depressive symptoms  Discharge Diagnoses:   AXIS I:  Schizoaffective Disorder AXIS II:  Deferred AXIS III:   Past Medical History  Diagnosis Date  . Bipolar 1 disorder   . Manic depression   . Schizophrenia   . Mandibular fracture 09/17/2013   AXIS IV:  economic problems, housing problems and other psychosocial or environmental problems AXIS V:  61-70 mild symptoms  Plan Of Care/Follow-up recommendations:  Activity:  normal activity Diet:  normal diet  Is patient on multiple antipsychotic therapies at discharge:  No   Has Patient had three or more failed trials of antipsychotic monotherapy by history:  No  Recommended Plan for Multiple Antipsychotic Therapies: NA  Emonnie Cannady D 11/02/2013, 10:49 AM

## 2013-11-02 NOTE — ED Notes (Signed)
Patient was awaken by another patient screaming on unit. No acute distress noted.

## 2013-11-03 NOTE — Consult Note (Signed)
Patient personally evaluated, note reviewed and agreed with 

## 2013-11-03 NOTE — Consult Note (Signed)
Patient personally evaluated and note agreed with 

## 2014-11-08 ENCOUNTER — Encounter (HOSPITAL_COMMUNITY): Payer: Self-pay

## 2014-11-08 ENCOUNTER — Emergency Department (HOSPITAL_COMMUNITY)
Admission: EM | Admit: 2014-11-08 | Discharge: 2014-11-08 | Disposition: A | Discharge status: Home / Self Care | Payer: Medicaid Other | Attending: Emergency Medicine | Admitting: Emergency Medicine

## 2014-11-08 DIAGNOSIS — F308 Other manic episodes: Secondary | ICD-10-CM | POA: Insufficient documentation

## 2014-11-08 DIAGNOSIS — Y831 Surgical operation with implant of artificial internal device as the cause of abnormal reaction of the patient, or of later complication, without mention of misadventure at the time of the procedure: Secondary | ICD-10-CM | POA: Diagnosis not present

## 2014-11-08 DIAGNOSIS — Z8781 Personal history of (healed) traumatic fracture: Secondary | ICD-10-CM | POA: Diagnosis not present

## 2014-11-08 DIAGNOSIS — T85848A Pain due to other internal prosthetic devices, implants and grafts, initial encounter: Secondary | ICD-10-CM

## 2014-11-08 DIAGNOSIS — Z720 Tobacco use: Secondary | ICD-10-CM | POA: Diagnosis not present

## 2014-11-08 DIAGNOSIS — T8584XA Pain due to internal prosthetic devices, implants and grafts, not elsewhere classified, initial encounter: Secondary | ICD-10-CM | POA: Diagnosis not present

## 2014-11-08 DIAGNOSIS — Z79899 Other long term (current) drug therapy: Secondary | ICD-10-CM | POA: Insufficient documentation

## 2014-11-08 DIAGNOSIS — K088 Other specified disorders of teeth and supporting structures: Secondary | ICD-10-CM | POA: Diagnosis present

## 2014-11-08 MED ORDER — HYDROCODONE-ACETAMINOPHEN 5-325 MG PO TABS: 1.0000 | ORAL_TABLET | Freq: Four times a day (QID) | ORAL | Status: DC | PRN

## 2014-11-08 MED ORDER — CHLORHEXIDINE GLUCONATE 0.12 % MT SOLN: 15.0000 mL | Freq: Two times a day (BID) | OROMUCOSAL | Status: DC

## 2014-11-08 NOTE — ED Notes (Signed)
Pt escorted to discharge window. Verbalized understanding discharge instructions. In no acute distress.   

## 2014-11-08 NOTE — Discharge Instructions (Signed)
As discussed, it is very important that you follow up with a dentist for definitive care of your pain.  Return here for concerning changes in your condition.

## 2014-11-08 NOTE — ED Notes (Signed)
Pt c/o L lower dental pain x 1 week.  Pain score 10/10.  Pt has not taken anything for the pain.  Pt does not have a dentist.
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
## 2015-04-25 ENCOUNTER — Encounter (HOSPITAL_COMMUNITY): Payer: Self-pay | Admitting: Family Medicine

## 2015-04-25 ENCOUNTER — Emergency Department (HOSPITAL_COMMUNITY)
Admission: EM | Admit: 2015-04-25 | Discharge: 2015-04-26 | Disposition: A | Discharge status: Home / Self Care | Payer: Medicaid Other | Attending: Emergency Medicine | Admitting: Emergency Medicine

## 2015-04-25 DIAGNOSIS — Z79899 Other long term (current) drug therapy: Secondary | ICD-10-CM | POA: Insufficient documentation

## 2015-04-25 DIAGNOSIS — Z8781 Personal history of (healed) traumatic fracture: Secondary | ICD-10-CM | POA: Diagnosis not present

## 2015-04-25 DIAGNOSIS — R079 Chest pain, unspecified: Secondary | ICD-10-CM | POA: Diagnosis present

## 2015-04-25 DIAGNOSIS — F319 Bipolar disorder, unspecified: Secondary | ICD-10-CM | POA: Diagnosis not present

## 2015-04-25 DIAGNOSIS — R0789 Other chest pain: Secondary | ICD-10-CM | POA: Insufficient documentation

## 2015-04-25 DIAGNOSIS — Z720 Tobacco use: Secondary | ICD-10-CM | POA: Diagnosis not present

## 2015-04-25 DIAGNOSIS — R0781 Pleurodynia: Secondary | ICD-10-CM

## 2015-04-25 NOTE — ED Notes (Signed)
Patient is complaining of right rib area pain. Pt reports pain started Sunday morning and denies injury. Pt is talking in full sentences with no respiratory complications.

## 2015-04-26 ENCOUNTER — Emergency Department (HOSPITAL_COMMUNITY): Payer: Medicaid Other

## 2015-04-26 NOTE — Discharge Instructions (Signed)
Please use ibuprofen as needed for pain, monitor for new or worsening signs or symptoms return immediately if any present. Avoid aggravating activities. Please quit smoking.

## 2015-04-26 NOTE — ED Provider Notes (Signed)
CSN: [id number]     Arrival date & time 04/25/15  2230 History   First MD Initiated Contact with Patient 04/26/15 0011     Chief Complaint  Patient presents with  . Chest Pain    HPI   Patient presents with pain to his right ribs. Patient reports that 4 days ago he was helping somebody move a couch when he felt sharp pain in the right rib area. Patient reports pain with palpation of the ribs or movement of the chest wall. Patient denies shortness of breath, pain with deep inspiration, respiratory difficulty, obvious trauma or signs of bruising to the ribs. Patient denies cough, fever, chills, nausea, vomiting, other chest pain, abdominal pain.    Past Medical History  Diagnosis Date  . Bipolar 1 disorder   . Manic depression   . Schizophrenia   . Mandibular fracture 09/17/2013   Past Surgical History  Procedure Laterality Date  . Orif mandibular fracture N/A 09/17/2013    Procedure: OPEN REDUCTION INTERNAL FIXATION (ORIF) MANDIBULAR FRACTURE;  Surgeon: Darletta Moll, MD;  Location: [facility] OR;  Service: ENT;  Laterality: N/A;  . Mouth surgery    . Mandibular hardware removal N/A 10/18/2013    Procedure: MANDIBULAR HARDWARE REMOVAL;  Surgeon: Darletta Moll, MD;  Location: [facility];  Service: ENT;  Laterality: N/A;   History reviewed. No pertinent family history. History  Substance Use Topics  . Smoking status: Current Every Day Smoker -- 0.50 packs/day    Types: Cigarettes  . Smokeless tobacco: Never Used  . Alcohol Use: Yes     Comment: Weekends.     Review of Systems  All other systems reviewed and are negative.     Allergies  Lithium; Depakote; and Zyprexa  Home Medications   Prior to Admission medications   Medication Sig Start Date End Date Taking? Authorizing Provider  benztropine (COGENTIN) 1 MG tablet Take 1 tablet (1 mg total) by mouth daily. 11/02/13  Yes Benjaman Pott, MD  haloperidol (HALDOL) 5 MG tablet Take 1 tablet (5 mg total) by mouth daily.  08/31/13  Yes Shuvon B Rankin, NP  benztropine (COGENTIN) 1 MG tablet Take 1 tablet (1 mg total) by mouth daily. Patient not taking: Reported on 04/26/2015 08/31/13   Shuvon B Rankin, NP  benztropine (COGENTIN) 1 MG tablet Take 1 tablet (1 mg total) by mouth daily. Patient not taking: Reported on 04/26/2015 11/02/13   Benjaman Pott, MD  chlorhexidine (PERIDEX) 0.12 % solution Use as directed 15 mLs in the mouth or throat 2 (two) times daily. Patient not taking: Reported on 04/26/2015 11/08/14   Gerhard Munch, MD  haloperidol (HALDOL) 5 MG tablet Take 1 tablet (5 mg total) by mouth 2 (two) times daily. Patient not taking: Reported on 04/26/2015 11/02/13   Benjaman Pott, MD  HYDROcodone-acetaminophen (NORCO/VICODIN) 5-325 MG per tablet Take 1 tablet by mouth every 6 (six) hours as needed for moderate pain or severe pain. Patient not taking: Reported on 04/26/2015 11/08/14   Gerhard Munch, MD  traZODone (DESYREL) 100 MG tablet Take 1 tablet (100 mg total) by mouth at bedtime. Patient not taking: Reported on 04/26/2015 11/02/13   Benjaman Pott, MD   BP 111/64 mmHg  Pulse 82  Temp(Src) 98.4 F (36.9 C) (Oral)  Resp 20  Ht 6' (1.829 m)  Wt 165 lb (74.844 kg)  BMI 22.37 kg/m2  SpO2 100%   Physical Exam  Constitutional: He is oriented to person, place, and  time. He appears well-developed and well-nourished.  HENT:  Head: Normocephalic and atraumatic.  Eyes: Conjunctivae are normal. Pupils are equal, round, and reactive to light. Right eye exhibits no discharge. Left eye exhibits no discharge. No scleral icterus.  Neck: Normal range of motion. No JVD present. No tracheal deviation present.  Cardiovascular: Normal rate, regular rhythm, normal heart sounds and intact distal pulses.  Exam reveals no gallop and no friction rub.   No murmur heard. Pulmonary/Chest: Effort normal. No stridor. No respiratory distress. He has no wheezes. He has no rales. He exhibits no tenderness.  Tenderness to  palpation of the right lateral ribs no signs of trauma no pain with AP compression of the chest  Musculoskeletal: Normal range of motion.  Neurological: He is alert and oriented to person, place, and time. Coordination normal.  Skin: Skin is warm and dry.  Psychiatric: He has a normal mood and affect. His behavior is normal. Judgment and thought content normal.  Nursing note and vitals reviewed.   ED Course  Procedures (including critical care time) Labs Review Labs Reviewed - No data to display  Imaging Review Dg Chest 2 View  04/26/2015   CLINICAL DATA:  Right-sided chest pain for several days. Shortness of breath tonight. Smoker.  EXAM: CHEST  2 VIEW  COMPARISON:  09/17/2013  FINDINGS: Hyperinflation. The heart size and mediastinal contours are within normal limits. Both lungs are clear. The visualized skeletal structures are unremarkable. Mild thoracic curvature convex towards the right.  IMPRESSION: No active cardiopulmonary disease.   Electronically Signed   By: Burman NievesWilliam  Stevens M.D.   On: 04/26/2015 00:46     EKG Interpretation None      MDM   Final diagnoses:  Rib pain    Labs:  Imaging: DG chest 2 view personally reviewed the images and agreed with radiologist reading no active cardiopulmonary disease  Consults:  Therapeutics:  Assessment:  Plan: Patient presents with rib pain today. Not significant enough that he would need to take medication, minimally painful to palpation no signs of trauma. Lung sounds equal bilateral, no respiratory distress or difficulty with deep inspiration. Patient discharged home with structures use ibuprofen as needed for pain, follow-up with primary care symptoms continue to persist or worsen. Patient verbalizes understanding and agreement today's plan.       Eyvonne MechanicJeffrey Pedro Whiters, PA-C 04/26/15 0129  Eber HongBrian Miller, MD 04/26/15 [phone]

## 2015-06-21 ENCOUNTER — Emergency Department (HOSPITAL_COMMUNITY)
Admission: EM | Admit: 2015-06-21 | Discharge: 2015-06-21 | Disposition: A | Discharge status: Home / Self Care | Payer: Medicaid Other | Attending: Emergency Medicine | Admitting: Emergency Medicine

## 2015-06-21 ENCOUNTER — Encounter (HOSPITAL_COMMUNITY): Payer: Self-pay

## 2015-06-21 DIAGNOSIS — R2241 Localized swelling, mass and lump, right lower limb: Secondary | ICD-10-CM | POA: Insufficient documentation

## 2015-06-21 DIAGNOSIS — Z79899 Other long term (current) drug therapy: Secondary | ICD-10-CM | POA: Insufficient documentation

## 2015-06-21 DIAGNOSIS — M7989 Other specified soft tissue disorders: Secondary | ICD-10-CM

## 2015-06-21 DIAGNOSIS — M79661 Pain in right lower leg: Secondary | ICD-10-CM | POA: Diagnosis present

## 2015-06-21 DIAGNOSIS — Z87828 Personal history of other (healed) physical injury and trauma: Secondary | ICD-10-CM | POA: Diagnosis not present

## 2015-06-21 DIAGNOSIS — F209 Schizophrenia, unspecified: Secondary | ICD-10-CM | POA: Diagnosis not present

## 2015-06-21 DIAGNOSIS — F319 Bipolar disorder, unspecified: Secondary | ICD-10-CM | POA: Diagnosis not present

## 2015-06-21 DIAGNOSIS — Z87891 Personal history of nicotine dependence: Secondary | ICD-10-CM | POA: Diagnosis not present

## 2015-06-21 NOTE — Discharge Instructions (Signed)
Contusion °A contusion is a deep bruise. Contusions are the result of an injury that caused bleeding under the skin. The contusion may turn blue, purple, or yellow. Minor injuries will give you a painless contusion, but more severe contusions may stay painful and swollen for a few weeks.  °CAUSES  °A contusion is usually caused by a blow, trauma, or direct force to an area of the body. °SYMPTOMS  °· Swelling and redness of the injured area. °· Bruising of the injured area. °· Tenderness and soreness of the injured area. °· Pain. °DIAGNOSIS  °The diagnosis can be made by taking a history and physical exam. An X-ray, CT scan, or MRI may be needed to determine if there were any associated injuries, such as fractures. °TREATMENT  °Specific treatment will depend on what area of the body was injured. In general, the best treatment for a contusion is resting, icing, elevating, and applying cold compresses to the injured area. Over-the-counter medicines may also be recommended for pain control. Ask your caregiver what the best treatment is for your contusion. °HOME CARE INSTRUCTIONS  °· Put ice on the injured area. °¨ Put ice in a plastic bag. °¨ Place a towel between your skin and the bag. °¨ Leave the ice on for 15-20 minutes, 3-4 times a day, or as directed by your health care provider. °· Only take over-the-counter or prescription medicines for pain, discomfort, or fever as directed by your caregiver. Your caregiver may recommend avoiding anti-inflammatory medicines (aspirin, ibuprofen, and naproxen) for 48 hours because these medicines may increase bruising. °· Rest the injured area. °· If possible, elevate the injured area to reduce swelling. °SEEK IMMEDIATE MEDICAL CARE IF:  °· You have increased bruising or swelling. °· You have pain that is getting worse. °· Your swelling or pain is not relieved with medicines. °MAKE SURE YOU:  °· Understand these instructions. °· Will watch your condition. °· Will get help right  away if you are not doing well or get worse. °Document Released: 07/09/2005 Document Revised: 10/04/2013 Document Reviewed: 08/04/2011 °[employer]® Patient Information ©2015 [employer]. This information is not intended to replace advice given to you by your health care provider. Make sure you discuss any questions you have with your health care provider. ° °

## 2015-06-21 NOTE — ED Notes (Signed)
Pt alert x4 respirations easy non labored.  

## 2015-06-21 NOTE — ED Notes (Signed)
Pt c/o swelling and tenderness to right leg unknown injury

## 2015-06-21 NOTE — ED Provider Notes (Signed)
CSN: [id number]     Arrival date & time 06/21/15  1019 History   First MD Initiated Contact with Patient 06/21/15 1034     Chief Complaint  Patient presents with  . Leg Injury     (Consider location/radiation/quality/duration/timing/severity/associated sxs/prior Treatment) Patient is a 29 y.o. male presenting with extremity pain.  Extremity Pain This is a new problem. The current episode started in the past 7 days. The problem occurs 2 to 4 times per day. The problem has been waxing and waning. Pertinent negatives include no joint swelling or rash. The symptoms are aggravated by walking. He has tried nothing for the symptoms.    Past Medical History  Diagnosis Date  . Bipolar 1 disorder   . Manic depression   . Schizophrenia   . Mandibular fracture 09/17/2013   Past Surgical History  Procedure Laterality Date  . Orif mandibular fracture N/A 09/17/2013    Procedure: OPEN REDUCTION INTERNAL FIXATION (ORIF) MANDIBULAR FRACTURE;  Surgeon: Darletta Moll, MD;  Location: [facility] OR;  Service: ENT;  Laterality: N/A;  . Mouth surgery    . Mandibular hardware removal N/A 10/18/2013    Procedure: MANDIBULAR HARDWARE REMOVAL;  Surgeon: Darletta Moll, MD;  Location: [facility];  Service: ENT;  Laterality: N/A;   History reviewed. No pertinent family history. Social History  Substance Use Topics  . Smoking status: Former Smoker -- 0.50 packs/day    Types: Cigarettes  . Smokeless tobacco: Never Used  . Alcohol Use: Yes     Comment: Weekends.     Review of Systems  Musculoskeletal: Negative for joint swelling.  Skin: Negative for rash.  All other systems reviewed and are negative.     Allergies  Lithium; Depakote; and Zyprexa  Home Medications   Prior to Admission medications   Medication Sig Start Date End Date Taking? Authorizing Provider  benztropine (COGENTIN) 1 MG tablet Take 1 tablet (1 mg total) by mouth daily. Patient not taking: Reported on 04/26/2015 08/31/13   Shuvon  B Rankin, NP  benztropine (COGENTIN) 1 MG tablet Take 1 tablet (1 mg total) by mouth daily. Patient not taking: Reported on 04/26/2015 11/02/13   Benjaman Pott, MD  benztropine (COGENTIN) 1 MG tablet Take 1 tablet (1 mg total) by mouth daily. 11/02/13   Benjaman Pott, MD  chlorhexidine (PERIDEX) 0.12 % solution Use as directed 15 mLs in the mouth or throat 2 (two) times daily. Patient not taking: Reported on 04/26/2015 11/08/14   Gerhard Munch, MD  haloperidol (HALDOL) 5 MG tablet Take 1 tablet (5 mg total) by mouth daily. 08/31/13   Shuvon B Rankin, NP  haloperidol (HALDOL) 5 MG tablet Take 1 tablet (5 mg total) by mouth 2 (two) times daily. Patient not taking: Reported on 04/26/2015 11/02/13   Benjaman Pott, MD  HYDROcodone-acetaminophen (NORCO/VICODIN) 5-325 MG per tablet Take 1 tablet by mouth every 6 (six) hours as needed for moderate pain or severe pain. Patient not taking: Reported on 04/26/2015 11/08/14   Gerhard Munch, MD  traZODone (DESYREL) 100 MG tablet Take 1 tablet (100 mg total) by mouth at bedtime. Patient not taking: Reported on 04/26/2015 11/02/13   Benjaman Pott, MD   BP 104/61 mmHg  Pulse 61  Temp(Src) 98 F (36.7 C) (Oral)  Resp 20  Wt 165 lb (74.844 kg)  SpO2 98% Physical Exam  Constitutional: He is oriented to person, place, and time. He appears well-developed and well-nourished.  HENT:  Head: Normocephalic.  Eyes: Conjunctivae are normal.  Neck: Neck supple.  Cardiovascular: Normal rate and regular rhythm.   Pulmonary/Chest: Effort normal and breath sounds normal.  Abdominal: Soft. Bowel sounds are normal.  Musculoskeletal: Normal range of motion. He exhibits edema and tenderness.       Right lower leg: He exhibits swelling.       Legs: Lymphadenopathy:    He has no cervical adenopathy.  Neurological: He is alert and oriented to person, place, and time.  Skin: Skin is warm and dry.  Psychiatric: He has a normal mood and affect.  Nursing note and vitals  reviewed.   ED Course  Procedures (including critical care time) Labs Review Labs Reviewed - No data to display  Imaging Review No results found.  EKG Interpretation None     No known injury. Small area of swelling and tenderness to lateral aspect of right leg above ankle. No increased warmth or redness noted.   MDM   Final diagnoses:  Pain and swelling of lower leg, right   Soft-tissue swelling. Symptomatic treatment. Return precautions discussed.     Felicie Morn, NP 06/21/15 1048  Laurence Spates, MD 06/21/15 [phone]

## 2015-09-16 ENCOUNTER — Emergency Department (HOSPITAL_COMMUNITY)
Admission: EM | Admit: 2015-09-16 | Discharge: 2015-09-20 | Disposition: A | Discharge status: Home / Self Care | Payer: Medicaid Other | Attending: Emergency Medicine | Admitting: Emergency Medicine

## 2015-09-16 DIAGNOSIS — F1210 Cannabis abuse, uncomplicated: Secondary | ICD-10-CM | POA: Diagnosis not present

## 2015-09-16 DIAGNOSIS — F312 Bipolar disorder, current episode manic severe with psychotic features: Secondary | ICD-10-CM | POA: Diagnosis present

## 2015-09-16 DIAGNOSIS — Z87891 Personal history of nicotine dependence: Secondary | ICD-10-CM | POA: Diagnosis not present

## 2015-09-16 DIAGNOSIS — F1220 Cannabis dependence, uncomplicated: Secondary | ICD-10-CM | POA: Diagnosis not present

## 2015-09-16 DIAGNOSIS — Z8781 Personal history of (healed) traumatic fracture: Secondary | ICD-10-CM | POA: Diagnosis not present

## 2015-09-16 DIAGNOSIS — F309 Manic episode, unspecified: Secondary | ICD-10-CM | POA: Diagnosis present

## 2015-09-16 DIAGNOSIS — F209 Schizophrenia, unspecified: Secondary | ICD-10-CM | POA: Diagnosis not present

## 2015-09-16 DIAGNOSIS — R45850 Homicidal ideations: Secondary | ICD-10-CM | POA: Insufficient documentation

## 2015-09-16 DIAGNOSIS — F1310 Sedative, hypnotic or anxiolytic abuse, uncomplicated: Secondary | ICD-10-CM | POA: Diagnosis not present

## 2015-09-16 DIAGNOSIS — R45851 Suicidal ideations: Secondary | ICD-10-CM | POA: Diagnosis not present

## 2015-09-16 LAB — CBC WITH DIFFERENTIAL/PLATELET
Basophils Absolute: 0 10*3/uL (ref 0.0–0.1)
Basophils Relative: 0 %
EOS ABS: 0 10*3/uL (ref 0.0–0.7)
Eosinophils Relative: 0 %
HCT: 41.3 % (ref 39.0–52.0)
Hemoglobin: 14 g/dL (ref 13.0–17.0)
LYMPHS ABS: 3 10*3/uL (ref 0.7–4.0)
LYMPHS PCT: 31 %
MCH: 32.6 pg (ref 26.0–34.0)
MCHC: 33.9 g/dL (ref 30.0–36.0)
MCV: 96.3 fL (ref 78.0–100.0)
Monocytes Absolute: 1.5 10*3/uL — ABNORMAL HIGH (ref 0.1–1.0)
Monocytes Relative: 15 %
Neutro Abs: 5.3 10*3/uL (ref 1.7–7.7)
Neutrophils Relative %: 54 %
Platelets: 258 10*3/uL (ref 150–400)
RBC: 4.29 MIL/uL (ref 4.22–5.81)
RDW: 13 % (ref 11.5–15.5)
WBC: 9.8 10*3/uL (ref 4.0–10.5)

## 2015-09-16 LAB — BASIC METABOLIC PANEL
Anion gap: 9 (ref 5–15)
BUN: 11 mg/dL (ref 6–20)
CHLORIDE: 103 mmol/L (ref 101–111)
CO2: 26 mmol/L (ref 22–32)
Calcium: 9.6 mg/dL (ref 8.9–10.3)
Creatinine, Ser: 0.98 mg/dL (ref 0.61–1.24)
GFR calc Af Amer: >60 mL/min (ref >60)
Glucose, Bld: 108 mg/dL — ABNORMAL HIGH (ref 65–99)
POTASSIUM: 4 mmol/L (ref 3.5–5.1)
SODIUM: 138 mmol/L (ref 135–145)

## 2015-09-16 LAB — ETHANOL: Alcohol, Ethyl (B): <5 mg/dL (ref <5)

## 2015-09-16 MED ORDER — ZIPRASIDONE MESYLATE 20 MG IM SOLR: 20.0000 mg | Freq: Once | INTRAMUSCULAR | Status: AC

## 2015-09-16 MED ORDER — LORAZEPAM 2 MG/ML IJ SOLN
2.0000 mg | Freq: Once | INTRAMUSCULAR | Status: AC
  Administered 2015-09-16: 2 mg via INTRAMUSCULAR
  Filled 2015-09-16: qty 1

## 2015-09-16 MED ORDER — DIPHENHYDRAMINE HCL 50 MG/ML IJ SOLN
50.0000 mg | Freq: Four times a day (QID) | INTRAMUSCULAR | Status: DC | PRN
  Administered 2015-09-17: 50 mg via INTRAMUSCULAR

## 2015-09-16 MED ORDER — ZIPRASIDONE MESYLATE 20 MG IM SOLR
INTRAMUSCULAR | Status: AC
  Administered 2015-09-16: 20 mg
  Filled 2015-09-16: qty 20

## 2015-09-16 MED ORDER — HALOPERIDOL LACTATE 5 MG/ML IJ SOLN
5.0000 mg | Freq: Once | INTRAMUSCULAR | Status: AC
  Administered 2015-09-16: 5 mg via INTRAMUSCULAR
  Filled 2015-09-16: qty 1

## 2015-09-16 MED ORDER — DIPHENHYDRAMINE HCL 50 MG/ML IJ SOLN
INTRAMUSCULAR | Status: AC
  Administered 2015-09-16: 50 mg
  Filled 2015-09-16: qty 1

## 2015-09-16 NOTE — ED Notes (Signed)
Pt would not allow anyone to take BP at time time state he do not have any blood

## 2015-09-16 NOTE — ED Notes (Signed)
Dr. Cyndie ChimeNguyen made aware of situation and that patient will not let us take vital signs, etc. MD at bedside.
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
## 2015-09-16 NOTE — ED Provider Notes (Signed)
CSN: [id number]     Arrival date & time 09/16/15  1606 History   First MD Initiated Contact with Patient 09/16/15 1613     Chief Complaint  Patient presents with  . Homicidal  . Manic Behavior     (Consider location/radiation/quality/duration/timing/severity/associated sxs/prior Treatment) HPI Comments: 29 y.o. Male with history of Bipolar 1 Disorder, manic dpression, schizophrenia presents via police for psychiatric evaluation.  The patient's mother reportedly called the police and said that the patient had taken her car and also had been reporting suicidal and homicidal thoughts.  He also reportedly has not been sleeping.  The mother was concerned about the patient's well being and for that reason filed paperwork for the patient to be involuntarily committed for treatment.  The patient refuses to answer any of my questions and is not cooperative.  He states that all he needs is to go smoke a blunt and that he medicates himself with marijuana and is fixated on marijuana.  I was not able to get him to cooperate with any history taking/question answering.  He does not seem to have insight or appropriate judgment at this time.   Past Medical History  Diagnosis Date  . Bipolar 1 disorder   . Manic depression   . Schizophrenia   . Mandibular fracture 09/17/2013   Past Surgical History  Procedure Laterality Date  . Orif mandibular fracture N/A 09/17/2013    Procedure: OPEN REDUCTION INTERNAL FIXATION (ORIF) MANDIBULAR FRACTURE;  Surgeon: Darletta MollSui W Teoh, MD;  [facility];  Service: ENT;  Laterality: N/A;  . Mouth surgery    . Mandibular hardware removal N/A 10/18/2013    Procedure: MANDIBULAR HARDWARE REMOVAL;  Surgeon: Darletta MollSui W Teoh, MD;  Location: [facility];  Service: ENT;  Laterality: N/A;   No family history on file. Social History  Substance Use Topics  . Smoking status: Former Smoker -- 0.50 packs/day    Types: Cigarettes  . Smokeless tobacco: Never Used  . Alcohol Use:  Yes     Comment: Weekends.     Review of Systems  Unable to perform ROS: Psychiatric disorder      Allergies  Lithium; Depakote; and Zyprexa  Home Medications   Prior to Admission medications   Medication Sig Start Date End Date Taking? Authorizing Provider  benztropine (COGENTIN) 1 MG tablet Take 1 tablet (1 mg total) by mouth daily. Patient not taking: Reported on 04/26/2015 08/31/13   Shuvon B Rankin, NP  benztropine (COGENTIN) 1 MG tablet Take 1 tablet (1 mg total) by mouth daily. Patient not taking: Reported on 04/26/2015 11/02/13   Benjaman PottGerald D Taylor, MD  benztropine (COGENTIN) 1 MG tablet Take 1 tablet (1 mg total) by mouth daily. Patient not taking: Reported on 09/16/2015 11/02/13   Benjaman PottGerald D Taylor, MD  haloperidol (HALDOL) 5 MG tablet Take 1 tablet (5 mg total) by mouth daily. Patient not taking: Reported on 09/16/2015 08/31/13   Shuvon B Rankin, NP  haloperidol (HALDOL) 5 MG tablet Take 1 tablet (5 mg total) by mouth 2 (two) times daily. Patient not taking: Reported on 04/26/2015 11/02/13   Benjaman PottGerald D Taylor, MD  HYDROcodone-acetaminophen (NORCO/VICODIN) 5-325 MG per tablet Take 1 tablet by mouth every 6 (six) hours as needed for moderate pain or severe pain. Patient not taking: Reported on 04/26/2015 11/08/14   Gerhard Munchobert Lockwood, MD  traZODone (DESYREL) 100 MG tablet Take 1 tablet (100 mg total) by mouth at bedtime. Patient not taking: Reported on 04/26/2015 11/02/13   Earvin HansenGerald  Irish Lack, MD   BP 104/60 mmHg  Pulse 85  Resp 18  SpO2 100% Physical Exam  Constitutional: He is oriented to person, place, and time. He appears well-developed and well-nourished. No distress.  HENT:  Head: Normocephalic and atraumatic.  Right Ear: External ear normal.  Left Ear: External ear normal.  Mouth/Throat: Oropharynx is clear and moist. No oropharyngeal exudate.  Eyes: EOM are normal. Pupils are equal, round, and reactive to light.  Neck: Normal range of motion. Neck supple.  Cardiovascular: Normal  rate, regular rhythm, normal heart sounds and intact distal pulses.   No murmur heard. Pulmonary/Chest: Effort normal. No respiratory distress. He has no wheezes. He has no rales.  Abdominal: Soft. He exhibits no distension. There is no tenderness.  Musculoskeletal: He exhibits no edema.  Neurological: He is alert and oriented to person, place, and time.  Skin: Skin is warm and dry. No rash noted. He is not diaphoretic.  Psychiatric: His affect is inappropriate. His speech is rapid and/or pressured and tangential. He is agitated, aggressive and hyperactive. He expresses impulsivity and inappropriate judgment. He expresses homicidal (reported) and suicidal (reported) ideation.  Vitals reviewed.   ED Course  Procedures (including critical care time) Labs Review Labs Reviewed  CBC WITH DIFFERENTIAL/PLATELET - Abnormal; Notable for the following:    Monocytes Absolute 1.5 (*)    All other components within normal limits  BASIC METABOLIC PANEL - Abnormal; Notable for the following:    Glucose, Bld 108 (*)    All other components within normal limits  ETHANOL  URINE RAPID DRUG SCREEN, HOSP PERFORMED    Imaging Review No results found. I have personally reviewed and evaluated these images and lab results as part of my medical decision-making.   EKG Interpretation None      MDM  Patient was seen and evaluated at bedside.  He was not cooperative and appeared to have no insight and to be inappropriate.  He did not seem competent to understand his need for treatment.  He required medical/chemical sedation for completion of work up and evaluated.  Has not been taking his psych medications.  Labs unremarkable.  Patient appears medically stable and is medically clear for psychiatric evaluation. Final diagnoses:  None    1. Mania  2. Suicidal/Homicidal ideation    Leta Baptist, MD 09/16/15 Rickey Primus

## 2015-09-16 NOTE — ED Notes (Signed)
Pt asked to sit back down in bed after approaching me when I opened the  curtain in his room. I explained to the pt why we couldn't have the curtain closed. The pt then began to get verbally abusive and telling me he would  Shoot me with his gun. All items removed from room.

## 2015-09-17 DIAGNOSIS — R45851 Suicidal ideations: Secondary | ICD-10-CM | POA: Diagnosis not present

## 2015-09-17 DIAGNOSIS — R45850 Homicidal ideations: Secondary | ICD-10-CM | POA: Diagnosis not present

## 2015-09-17 DIAGNOSIS — F312 Bipolar disorder, current episode manic severe with psychotic features: Secondary | ICD-10-CM | POA: Diagnosis present

## 2015-09-17 DIAGNOSIS — F1220 Cannabis dependence, uncomplicated: Secondary | ICD-10-CM | POA: Diagnosis not present

## 2015-09-17 LAB — RAPID URINE DRUG SCREEN, HOSP PERFORMED
Amphetamines: NOT DETECTED
Barbiturates: NOT DETECTED
Benzodiazepines: POSITIVE — AB
Cocaine: NOT DETECTED
OPIATES: NOT DETECTED
TETRAHYDROCANNABINOL: POSITIVE — AB

## 2015-09-17 MED ORDER — STERILE WATER FOR INJECTION IJ SOLN
INTRAMUSCULAR | Status: AC
  Filled 2015-09-17: qty 10

## 2015-09-17 MED ORDER — ZIPRASIDONE MESYLATE 20 MG IM SOLR
20.0000 mg | Freq: Once | INTRAMUSCULAR | Status: AC
  Administered 2015-09-17: 20 mg via INTRAMUSCULAR
  Filled 2015-09-17: qty 20

## 2015-09-17 MED ORDER — LORAZEPAM 2 MG/ML IJ SOLN
2.0000 mg | Freq: Once | INTRAMUSCULAR | Status: AC
  Administered 2015-09-17: 2 mg via INTRAMUSCULAR
  Filled 2015-09-17: qty 1

## 2015-09-17 MED ORDER — ZIPRASIDONE MESYLATE 20 MG IM SOLR
INTRAMUSCULAR | Status: AC
  Filled 2015-09-17: qty 20

## 2015-09-17 MED ORDER — TRAZODONE HCL 100 MG PO TABS
100.0000 mg | ORAL_TABLET | Freq: Every day | ORAL | Status: DC
  Administered 2015-09-19: 100 mg via ORAL
  Filled 2015-09-17 (×2): qty 1

## 2015-09-17 MED ORDER — BENZTROPINE MESYLATE 1 MG PO TABS: 1.0000 mg | ORAL_TABLET | Freq: Every day | ORAL | Status: DC

## 2015-09-17 MED ORDER — HALOPERIDOL 5 MG PO TABS
5.0000 mg | ORAL_TABLET | Freq: Two times a day (BID) | ORAL | Status: DC
  Administered 2015-09-19 – 2015-09-20 (×3): 5 mg via ORAL
  Filled 2015-09-17 (×4): qty 1

## 2015-09-17 MED ORDER — OXCARBAZEPINE 300 MG PO TABS
300.0000 mg | ORAL_TABLET | Freq: Two times a day (BID) | ORAL | Status: DC
  Administered 2015-09-19 – 2015-09-20 (×3): 300 mg via ORAL
  Filled 2015-09-17 (×8): qty 1

## 2015-09-17 MED ORDER — AMANTADINE HCL 100 MG PO CAPS
100.0000 mg | ORAL_CAPSULE | Freq: Two times a day (BID) | ORAL | Status: DC
  Administered 2015-09-19 – 2015-09-20 (×3): 100 mg via ORAL
  Filled 2015-09-17 (×8): qty 1

## 2015-09-17 MED ORDER — DIPHENHYDRAMINE HCL 50 MG/ML IJ SOLN
50.0000 mg | Freq: Once | INTRAMUSCULAR | Status: AC
  Administered 2015-09-17: 50 mg via INTRAMUSCULAR
  Filled 2015-09-17: qty 1

## 2015-09-17 MED ORDER — DIPHENHYDRAMINE HCL 50 MG/ML IJ SOLN
50.0000 mg | Freq: Once | INTRAMUSCULAR | Status: AC
  Filled 2015-09-17: qty 1

## 2015-09-17 NOTE — BH Assessment (Signed)
Attempted to contact petitioner to gather collateral information but there was no answer.

## 2015-09-17 NOTE — ED Notes (Signed)
All furniture removed from patient room for safety.  Mattress on floor.  15 minute checks and video monitoring continue.

## 2015-09-17 NOTE — Progress Notes (Signed)
CM spoke with Velna HatchetSheila at [facility] [address] Pt is still active with services Pt last appt with them was on October 13/16 with Gwinda PasseMichelle Edwards NP

## 2015-09-17 NOTE — BH Assessment (Addendum)
[facility] Assessment Progress Note  The following facilities have been contacted to seek placement for this pt, with results as noted:  Beds available, information sent, decision pending:  [facility]   At capacity:  [facility]   [facility] [place], [place] Triage Specialist [phone]

## 2015-09-17 NOTE — ED Notes (Signed)
After rounds patient became very agitated.  Yelling and cursing into phone, disrupting the unit.  He then began throwing items in his room and hitting the walls.  Prn order obtained from md.

## 2015-09-17 NOTE — Consult Note (Signed)
[place] [facility] Consult   Reason for Consult: Agitation, mood lability, Psychosis, Homicidal ideation,  Referring Physician: EDP Patient Identification: Rodney Chandler MRN:  [id number] Principal Diagnosis: Bipolar affective disorder, current episode manic with psychotic symptoms [facility]) Diagnosis:   Patient Active Problem List   Diagnosis Date Noted  . Bipolar affective disorder, current episode manic with psychotic symptoms ([place]) [F31.2] 09/17/2015    Priority: High  . Cannabis use disorder, severe, dependence ([place]) [F12.20] 09/17/2015    Priority: High  . Concussion with loss of consciousness <= 30 min [S06.0X1A] 09/17/2013  . Acute respiratory failure ([place]) [J96.00] 09/17/2013  . 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 Medication management [Z79.899] 08/31/2013    Total Time spent with patient: 45 minutes  Subjective:   Rodney Chandler is a 29 y.o. male patient admitted with for manic and homicidal ideation.  HPI:  29 y.o. Male with history of Cannabis dependence,  Bipolar 1 Disorder- manic who was IVC'ed by his mother due to manic behavior, psychosis and out of control behavior. The patient's mother reportedly called the police and said that the patient took her car without permission and also had been reporting suicidal and homicidal thoughts. She also reports that patient has not been compliant with his medications but has been self medicating with Cannabis. Patient reports command auditory hallucinations, hearing voices telling him to rob, kill and shoot people. He has been acting bizarre, getting easily agitated, labile and has not slept for days. Patient is uncooperative, disruptive and making threats. He has no insight to his illness and has been exercising poor judgment.   Past Psychiatric History: Bipolar 1 manic   Risk to Self: Suicidal Ideation: Yes Suicidal Intent: No Is patient at risk for suicide?: No Suicidal Plan?: No Access to Means:  No What has been your use of drugs/alcohol within the last 12 months?: THC daily.  How many times?: 0 Other Self Harm Risks: N/A Triggers for Past Attempts: None known Intentional Self Injurious Behavior: None Risk to Others: Homicidal Ideation: No Thoughts of Harm to Others: No Current Homicidal Intent: No Current Homicidal Plan: No Access to Homicidal Means: No Identified Victim: N/A History of harm to others?: No Assessment of Violence: On admission Violent Behavior Description: Pt is cooperative at this time.  Does patient have access to weapons?: No Criminal Charges Pending?: Yes Describe Pending Criminal Charges: Marijuana possession  Does patient have a court date: Yes Court Date:  (December 2016) Prior Inpatient Therapy: Prior Inpatient Therapy: Yes Prior Therapy Dates:  (Pt unable to recall) Prior Therapy Facilty/Provider(s): [facility] [place]  Reason for Treatment: Bipolar Prior Outpatient Therapy: Prior Outpatient Therapy: Yes Prior Therapy Dates: Pt unable to recall  Prior Therapy Facilty/Provider(s): [facility]  Reason for Treatment: Medication management  Does patient have an ACCT team?: Unknown Does patient have Intensive In-House Services?  : No Does patient have [facility] services? : Yes Does patient have [facility] services?: No  Past Medical History:  Past Medical History  Diagnosis Date  . Bipolar 1 disorder   . Manic depression   . Schizophrenia   . Mandibular fracture 09/17/2013    Past Surgical History  Procedure Laterality Date  . Orif mandibular fracture N/A 09/17/2013    Procedure: OPEN REDUCTION INTERNAL FIXATION (ORIF) MANDIBULAR FRACTURE;  Surgeon: Ascencion Dike, MD;  Location: [place];  Service: ENT;  Laterality: N/A;  . Mouth surgery    . Mandibular hardware removal N/A 10/18/2013    Procedure: MANDIBULAR HARDWARE REMOVAL;  Surgeon:  Ascencion Dike, MD;  Location: [place];  Service: ENT;  Laterality: N/A;   Family History: No family history on  file. Family Psychiatric  History:  Social History:  History  Alcohol Use  . Yes    Comment: Weekends.      History  Drug Use  . 14.00 per week  . Special: Marijuana    Comment: Last used: 3 hours ago    Social History   Social History  . Marital Status: Single    Spouse Name: N/A  . Number of Children: N/A  . Years of Education: N/A   Social History Main Topics  . Smoking status: Former Smoker -- 0.50 packs/day    Types: Cigarettes  . Smokeless tobacco: Never Used  . Alcohol Use: Yes     Comment: Weekends.   . Drug Use: 14.00 per week    Special: Marijuana     Comment: Last used: 3 hours ago  . Sexual Activity: Not on file   Other Topics Concern  . Not on file   Social History Narrative   Additional Social History:    History of alcohol / drug use?: Yes Name of Substance 1: THC  1 - Age of First Use: 13 1 - Amount (size/oz): " a lot" 1 - Frequency:  daily  1 - Duration: ongoing  1 - Last Use / Amount: 09-16-15                   Allergies:   Allergies  Allergen Reactions  . Lithium Other (See Comments)    NOSEBLEED  . Depakote [Divalproex Sodium] Other (See Comments)    NOSEBLEED  . Zyprexa [Olanzapine] Other (See Comments)    NOSEBLEED    Labs:  Results for orders placed or performed during the hospital encounter of 09/16/15 (from the past 48 hour(s))  CBC with Differential     Status: Abnormal   Collection Time: 09/16/15  4:46 PM  Result Value Ref Range   WBC 9.8 4.0 - 10.5 K/uL   RBC 4.29 4.22 - 5.81 MIL/uL   Hemoglobin 14.0 13.0 - 17.0 g/dL   HCT 41.3 39.0 - 52.0 %   MCV 96.3 78.0 - 100.0 fL   MCH 32.6 26.0 - 34.0 pg   MCHC 33.9 30.0 - 36.0 g/dL   RDW 13.0 11.5 - 15.5 %   Platelets 258 150 - 400 K/uL   Neutrophils Relative % 54 %   Neutro Abs 5.3 1.7 - 7.7 K/uL   Lymphocytes Relative 31 %   Lymphs Abs 3.0 0.7 - 4.0 K/uL   Monocytes Relative 15 %   Monocytes Absolute 1.5 (H) 0.1 - 1.0 K/uL   Eosinophils Relative 0 %    Eosinophils Absolute 0.0 0.0 - 0.7 K/uL   Basophils Relative 0 %   Basophils Absolute 0.0 0.0 - 0.1 K/uL  Basic metabolic panel     Status: Abnormal   Collection Time: 09/16/15  4:46 PM  Result Value Ref Range   Sodium 138 135 - 145 mmol/L   Potassium 4.0 3.5 - 5.1 mmol/L   Chloride 103 101 - 111 mmol/L   CO2 26 22 - 32 mmol/L   Glucose, Bld 108 (H) 65 - 99 mg/dL   BUN 11 6 - 20 mg/dL   Creatinine, Ser 0.98 0.61 - 1.24 mg/dL   Calcium 9.6 8.9 - 10.3 mg/dL   GFR calc non Af Amer >60 >60 mL/min   GFR calc Af Amer >60 >  60 mL/min    Comment: (NOTE) The eGFR has been calculated using the CKD EPI equation. This calculation has not been validated in all clinical situations. eGFR's persistently <60 mL/min signify possible Chronic Kidney Disease.    Anion gap 9 5 - 15  Ethanol     Status: None   Collection Time: 09/16/15  4:46 PM  Result Value Ref Range   Alcohol, Ethyl (B) <5 <5 mg/dL    Comment:        LOWEST DETECTABLE LIMIT FOR SERUM ALCOHOL IS 5 mg/dL FOR MEDICAL PURPOSES ONLY   Urine rapid drug screen (hosp performed)     Status: Abnormal   Collection Time: 09/17/15  7:45 AM  Result Value Ref Range   Opiates NONE DETECTED NONE DETECTED   Cocaine NONE DETECTED NONE DETECTED   Benzodiazepines POSITIVE (A) NONE DETECTED   Amphetamines NONE DETECTED NONE DETECTED   Tetrahydrocannabinol POSITIVE (A) NONE DETECTED   Barbiturates NONE DETECTED NONE DETECTED    Comment:        DRUG SCREEN FOR MEDICAL PURPOSES ONLY.  IF CONFIRMATION IS NEEDED FOR ANY PURPOSE, NOTIFY LAB WITHIN 5 DAYS.        LOWEST DETECTABLE LIMITS FOR URINE DRUG SCREEN Drug Class       Cutoff (ng/mL) Amphetamine      1000 Barbiturate      200 Benzodiazepine   737 Tricyclics       106 Opiates          300 Cocaine          300 THC              50     Current Facility-Administered Medications  Medication Dose Route Frequency Provider Last Rate Last Dose  . amantadine (SYMMETREL) capsule 100 mg  100 mg  Oral BID Jonathen Rathman      . diphenhydrAMINE (BENADRYL) injection 50 mg  50 mg Intramuscular Q6H PRN Harriet Butte, NP   50 mg at 09/17/15 0944  . haloperidol (HALDOL) tablet 5 mg  5 mg Oral BID Doristine Shehan      . Oxcarbazepine (TRILEPTAL) tablet 300 mg  300 mg Oral BID Ellanie Oppedisano      . sterile water (preservative free) injection           . traZODone (DESYREL) tablet 100 mg  100 mg Oral QHS Aydon Swamy      . ziprasidone (GEODON) 20 MG injection            Current Outpatient Prescriptions  Medication Sig Dispense Refill  . benztropine (COGENTIN) 1 MG tablet Take 1 tablet (1 mg total) by mouth daily. (Patient not taking: Reported on 04/26/2015) 30 tablet 0  . benztropine (COGENTIN) 1 MG tablet Take 1 tablet (1 mg total) by mouth daily. (Patient not taking: Reported on 04/26/2015) 30 tablet 0  . benztropine (COGENTIN) 1 MG tablet Take 1 tablet (1 mg total) by mouth daily. (Patient not taking: Reported on 09/16/2015) 30 tablet 0  . haloperidol (HALDOL) 5 MG tablet Take 1 tablet (5 mg total) by mouth daily. (Patient not taking: Reported on 09/16/2015) 30 tablet 0  . haloperidol (HALDOL) 5 MG tablet Take 1 tablet (5 mg total) by mouth 2 (two) times daily. (Patient not taking: Reported on 04/26/2015) 60 tablet 0  . HYDROcodone-acetaminophen (NORCO/VICODIN) 5-325 MG per tablet Take 1 tablet by mouth every 6 (six) hours as needed for moderate pain or severe pain. (Patient not taking: Reported on 04/26/2015) 15  tablet 0  . traZODone (DESYREL) 100 MG tablet Take 1 tablet (100 mg total) by mouth at bedtime. (Patient not taking: Reported on 04/26/2015) 30 tablet o    Musculoskeletal: Strength & Muscle Tone: within normal limits Gait & Station: normal Patient leans: N/A  Psychiatric Specialty Exam: Review of Systems  Constitutional: Negative.   HENT: Negative.   Eyes: Negative.   Respiratory: Negative.   Cardiovascular: Negative.   Gastrointestinal: Negative.   Genitourinary:  Negative.   Musculoskeletal: Negative.   Skin: Negative.   Neurological: Negative.   Endo/Heme/Allergies: Negative.   Psychiatric/Behavioral: Positive for suicidal ideas and hallucinations. The patient is nervous/anxious and has insomnia.     Blood pressure 109/92, pulse 52, temperature 97.8 F (36.6 C), temperature source Oral, resp. rate 18, SpO2 100 %.There is no weight on file to calculate BMI.  General Appearance: Disheveled  Eye Contact::  Good  Speech:  loud  Volume:  Increased  Mood:  Angry and Irritable  Affect:  Labile  Thought Process:  Disorganized and Tangential  Orientation:  Full (Time, Place, and Person)  Thought Content:  Hallucinations: Auditory  Suicidal Thoughts:  Yes.  without intent/plan  Homicidal Thoughts:  Yes.  without intent/plan  Memory:  Immediate;   Fair Recent;   Fair Remote;   Good  Judgement:  Impaired  Insight:  Lacking  Psychomotor Activity:  Restlessness and agitated  Concentration:  Poor  Recall:  [place]  Language: Good  Akathisia:  No  Handed:  Right  AIMS (if indicated):     Assets:  Communication Skills  ADL's:  Intact  Cognition: WNL  Sleep:   poor   Treatment Plan Summary: Daily contact with patient to assess and evaluate symptoms and progress in treatment.   Medication management: -Haldol 5mg  bid for psychosis -Amantadine 100mg  bid for EPS prevention. -Trileptal 300mg  bid for mood stabilization. -Trazodone 100mg  qhs for sleep.  Disposition: Recommend psychiatric Inpatient admission when medically cleared.  Corena Pilgrim, MD 09/17/2015 10:28 AM

## 2015-09-17 NOTE — ED Notes (Signed)
Pt sleeping at present, no distress noted, calm & cooperative at present.  Monitoring for safety, Q 15 min checks in effect. 

## 2015-09-17 NOTE — BH Assessment (Addendum)
Tele Assessment Note   Rodney Chandler is an 29 y.o. male presenting to [facility] due to being petitioned by his mother for involuntary commitment. Pt stated "my mother brought me here because she thought I was going crazy". "I was just sitting in my grandma yard rolling a blunt". Pt denies SI, HI and AVH at this time. PT did not report any previous suicide attempts or self-injurious behaviors. Pt reported that he has had several psychiatric hospitalizations; however he was unable to recall dates at this time. Pt reported that he receives medication management and shared that he is either prescribed Cogentin or Haldol. Pt reported that the last time he took his medication was approximately 1 week ago because it was making him weak and bothering his stomach. PT was unable to provide the name of his provider at this time. Pt did not report any depressive symptoms but shared that he doesn't sleep much. Pt reported that he has a lot to do and appears full of energy at this time. Pt reported that he smokes marijuana daily but did not share any alcohol or other illicit substance abuse at this time. Pt did not report any physical, sexual or emotional abuse at this time.  AM Psych eval for final disposition.   Diagnosis: Bipolar    Past Medical History:  Past Medical History  Diagnosis Date  . Bipolar 1 disorder   . Manic depression   . Schizophrenia   . Mandibular fracture 09/17/2013    Past Surgical History  Procedure Laterality Date  . Orif mandibular fracture N/A 09/17/2013    Procedure: OPEN REDUCTION INTERNAL FIXATION (ORIF) MANDIBULAR FRACTURE;  Surgeon: Darletta Moll, MD;  Location: [facility] OR;  Service: ENT;  Laterality: N/A;  . Mouth surgery    . Mandibular hardware removal N/A 10/18/2013    Procedure: MANDIBULAR HARDWARE REMOVAL;  Surgeon: Darletta Moll, MD;  Location: [facility];  Service: ENT;  Laterality: N/A;    Family History: No family history on file.  Social History:  reports that  he has quit smoking. His smoking use included Cigarettes. He smoked 0.50 packs per day. He has never used smokeless tobacco. He reports that he drinks alcohol. He reports that he uses illicit drugs (Marijuana) about 14 times per week.  Additional Social History:  Alcohol / Drug Use History of alcohol / drug use?: Yes Substance #1 Name of Substance 1: THC  1 - Age of First Use: 13 1 - Amount (size/oz): " a lot" 1 - Frequency:  daily  1 - Duration: ongoing  1 - Last Use / Amount: 09-16-15  CIWA: CIWA-Ar BP: (!) 124/52 mmHg Pulse Rate: 67 COWS:    PATIENT STRENGTHS: (choose at least two) Average or above average intelligence Supportive family/friends    Allergies:  Allergies  Allergen Reactions  . Lithium Other (See Comments)    NOSEBLEED  . Depakote [Divalproex Sodium] Other (See Comments)    NOSEBLEED  . Zyprexa [Olanzapine] Other (See Comments)    NOSEBLEED    Home Medications:  (Not in a hospital admission)  OB/GYN Status:  No LMP for male patient.  General Assessment Data [facility]: [facility] TTS Assessment: In system Is this a Tele or Face-to-Face Assessment?: Face-to-Face Is this an Initial Assessment or a Re-assessment for this encounter?: Initial Assessment Marital status: Single Living Arrangements: Spouse/significant other Can pt return to current living arrangement?: Yes Admission Status: Involuntary Is patient capable of signing voluntary admission?: Yes Referral Source:  Self/Family/Friend Insurance type: Medicaid      Crisis Care Plan Living Arrangements: Spouse/significant other Name of Psychiatrist: PT unable to recall.  Name of Therapist: None   Education Status Is patient currently in school?: No Current Grade: N/A Highest grade of school patient has completed: N/A Name of school: N/A Contact person: N/A  Risk to self with the past 6 months Suicidal Ideation: No Has patient been a risk to self within the past 6 months prior to  admission? : No Suicidal Intent: No Has patient had any suicidal intent within the past 6 months prior to admission? : No Is patient at risk for suicide?: No Suicidal Plan?: No Has patient had any suicidal plan within the past 6 months prior to admission? : No Access to Means: No What has been your use of drugs/alcohol within the last 12 months?: THC daily.  Previous Attempts/Gestures: No How many times?: 0 Other Self Harm Risks: N/A Triggers for Past Attempts: None known Intentional Self Injurious Behavior: None Family Suicide History: Unable to assess Recent stressful life event(s):  (None reported ) Persecutory voices/beliefs?: No Depression:  (Pt denies ) Depression Symptoms:  (PT denies ) Substance abuse history and/or treatment for substance abuse?: Yes Suicide prevention information given to non-admitted patients: Not applicable  Risk to Others within the past 6 months Homicidal Ideation: No Does patient have any lifetime risk of violence toward others beyond the six months prior to admission? : No Thoughts of Harm to Others: No Current Homicidal Intent: No Current Homicidal Plan: No Access to Homicidal Means: No Identified Victim: N/A History of harm to others?: No Assessment of Violence: On admission Violent Behavior Description: Pt is cooperative at this time.  Does patient have access to weapons?: No Criminal Charges Pending?: Yes Describe Pending Criminal Charges: Marijuana possession  Does patient have a court date: Yes Court Date:  (December 2016) Is patient on probation?: No  Psychosis Hallucinations: None noted Delusions: None noted  Mental Status Report Appearance/Hygiene: In scrubs Eye Contact: Good Motor Activity: Freedom of movement Speech: Logical/coherent, Rapid Level of Consciousness: Alert Mood: Pleasant, Elated Affect: Appropriate to circumstance Anxiety Level: Minimal Thought Processes: Tangential Judgement: Partial Orientation:  Appropriate for developmental age Obsessive Compulsive Thoughts/Behaviors: None  Cognitive Functioning Concentration: Decreased Memory: Recent Intact, Remote Intact IQ: Average Insight: Poor Impulse Control: Poor Appetite: Good Weight Loss: 0 Weight Gain: 0 Sleep: Decreased ("I don't sleep" ) Vegetative Symptoms: None  ADLScreening [facility] Assessment Services) Patient's cognitive ability adequate to safely complete daily activities?: Yes Patient able to express need for assistance with ADLs?: Yes Independently performs ADLs?: Yes (appropriate for developmental age)  Prior Inpatient Therapy Prior Inpatient Therapy: Yes Prior Therapy Dates:  (Pt unable to recall) Prior Therapy Facilty/Provider(s): [facility], [facility]  Reason for Treatment: Bipolar  Prior Outpatient Therapy Prior Outpatient Therapy: Yes Prior Therapy Dates: Pt unable to recall  Prior Therapy Facilty/Provider(s): [facility]  Reason for Treatment: Medication management  Does patient have an ACCT team?: Unknown Does patient have Intensive In-House Services?  : No Does patient have [facility] services? : Yes Does patient have P4CC services?: No  ADL Screening (condition at time of admission) Patient's cognitive ability adequate to safely complete daily activities?: Yes Is the patient deaf or have difficulty hearing?: No Does the patient have difficulty seeing, even when wearing glasses/contacts?: No Does the patient have difficulty concentrating, remembering, or making decisions?: No Patient able to express need for assistance with ADLs?: Yes Does the patient have difficulty dressing or bathing?: No  Independently performs ADLs?: Yes (appropriate for developmental age) Does the patient have difficulty walking or climbing stairs?: No       Abuse/Neglect Assessment (Assessment to be complete while patient is alone) Physical Abuse: Denies Verbal Abuse: Denies Sexual Abuse: Denies Exploitation of patient/patient's resources:  Denies Self-Neglect: Denies     Merchant navy officer (For Healthcare) Does patient have an advance directive?: No    Additional Information 1:1 In Past 12 Months?: No CIRT Risk: No Elopement Risk: No Does patient have medical clearance?: Yes     Disposition:  Disposition Initial Assessment Completed for this Encounter: Yes  Fabrizzio Marcella S 09/17/2015 6:35 AM

## 2015-09-17 NOTE — Progress Notes (Signed)
Medicine, [facility] Schedule an appointment as soon as possible for a visit This is your assigned Medicaid [place] access doctor If you prefer another contact DSS [phone] DSS assigned your doctor *You may receive a bill if you go to any family Dr not assigned to you [address] [place] [address] [phone] Medicaid [place] Access Covered Patient Schedule an appointment as soon as possible for a visit USE THIS WEBSITE TO ASSIST WITH UNDERSTANDING YOUR COVERAGE, [place] Transportation to Dr appts if you are have full Medicaid: [phone], [phone] or [phone] Transportation Supervisor [phone] As a Medicaid client you MUST contact DSS/SSI each time you change address, move to another [place] county or another state to keep your address updated [place]: [phone] [address] [place], [place] [address] [email or web address]

## 2015-09-17 NOTE — ED Notes (Signed)
Pt presented to desk, requesting to us the phone after 9pm phone hours. Staff explained to pt that he would be able to use the phone at 9am tomorrow, pt started cursing, went back to room 37 and punched the respiratory cabinet doors off the hinges.  Security and [employer] at bedside for assistance.  Pt refused night time meds.  Called PA BrahamSpencer for med orders.  Pt removed shirt, shadow boxing, doing pushups in the room.  Pt punching door and walls in the room.  IM meds given.
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
## 2015-09-18 MED ORDER — DIPHENHYDRAMINE HCL 50 MG/ML IJ SOLN
50.0000 mg | Freq: Once | INTRAMUSCULAR | Status: AC
  Administered 2015-09-18: 50 mg via INTRAMUSCULAR
  Filled 2015-09-18: qty 1

## 2015-09-18 MED ORDER — LORAZEPAM 2 MG/ML IJ SOLN
2.0000 mg | Freq: Once | INTRAMUSCULAR | Status: AC
  Administered 2015-09-18: 2 mg via INTRAMUSCULAR
  Filled 2015-09-18: qty 1

## 2015-09-18 MED ORDER — ZIPRASIDONE MESYLATE 20 MG IM SOLR
20.0000 mg | Freq: Once | INTRAMUSCULAR | Status: AC
  Filled 2015-09-18: qty 20

## 2015-09-18 MED ORDER — ZIPRASIDONE MESYLATE 20 MG IM SOLR
INTRAMUSCULAR | Status: AC
  Administered 2015-09-18: 20 mg
  Filled 2015-09-18: qty 20

## 2015-09-18 MED ORDER — STERILE WATER FOR INJECTION IJ SOLN
INTRAMUSCULAR | Status: AC
  Filled 2015-09-18: qty 10

## 2015-09-18 NOTE — ED Notes (Signed)
Patient appears anxious, restless. Denies SI, HI, AVH. Denies feelings of depression, only reports worry about "getting out and handling my business". Patient refuses ordered medications. States that he was taking meds until a few weeks ago. States that they make him feel sick. States his arms are "sore" from getting shots that he did not want. Made threatening comments in regards to getting any more shots, stating he will knock someone out.   Encouragement offered. Beverage provided.  Q 15 safety checks continue.

## 2015-09-18 NOTE — BH Assessment (Signed)
B[facility] Assessment Progress Note  The following facilities have been contacted to seek placement for this pt, with results as noted:  Beds available, information sent, decision pending:  [facility]   At capacity:  [name]   Cobi Delph, MA Triage Specialist [phone]     
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
## 2015-09-18 NOTE — BH Assessment (Signed)
Reassessment:   Writer attempted to reassess patient but he was not arousable. Patient sleeping soundly. Patient given chemical restraints due to yelling, cursing at staff, and hitting walls. Writer will attempt to assess patient when he becomes alert, calm, and cooperative. Meanwhile, Dr. Jannifer FranklinAkintayo and Julieanne CottonJosephine, NP continue to recommend inpatient treatment.

## 2015-09-18 NOTE — BH Assessment (Addendum)
BHH Assessment Progress Note  Per Thedore MinsMojeed Akintayo, MD, this pt continues to require psychiatric hospitalization.  [facility].  At 12:43 I spoke to Irving Burtonmily at the [facility] who authorized referral, authorization [id number] from 09/18/2015 - 09/24/2015.  Please note that authorization does not mean that pt has been accepted to the facility.  At 12:51 I [facility] to [place] at [facility], who took demographic information.  [facility].  Doylene Canninghomas Gino Garrabrant, MA Triage Specialist [phone]   Addendum:  At 13:26 Junious DresserConnie [facility].  Pt is now on wait list.  Doylene Canninghomas Brighton Pilley, MA Triage Specialist [phone]
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
## 2015-09-19 DIAGNOSIS — F312 Bipolar disorder, current episode manic severe with psychotic features: Secondary | ICD-10-CM

## 2015-09-19 DIAGNOSIS — F309 Manic episode, unspecified: Secondary | ICD-10-CM | POA: Insufficient documentation

## 2015-09-19 MED ORDER — ACETAMINOPHEN 325 MG PO TABS
650.0000 mg | ORAL_TABLET | Freq: Four times a day (QID) | ORAL | Status: DC | PRN
  Administered 2015-09-19 (×2): 650 mg via ORAL
  Filled 2015-09-19 (×2): qty 2

## 2015-09-19 NOTE — BH Assessment (Signed)
B[facility] Assessment Progress Note  The following facilities have been contacted to seek placement for this pt, with results as noted:  Beds available, information sent, decision pending:  [name]   No beds available, but accepting referrals for future consideration; information sent:  [name]   At capacity[facility]  [name]   Teshia [place], [place] Triage Specialist [phone]

## 2015-09-19 NOTE — BH Assessment (Signed)
BHH Assessment Progress Note  At 08:26 [facility] remains on their wait list.  Doylene Canninghomas Mackinsey Pelland, MA Triage Specialist [phone]

## 2015-09-19 NOTE — ED Notes (Signed)
Pt spoke with Clinical research associatewriter in the hall. Pt has been restless; pacing and exercising. Cooperative, calm. Asked if his mother could sign him out. Reminded pt of IVC process and pt stated understanding. Continues to report that he does not like his prescribed medications, continues to say that they make him feel sick and he only takes medication when he feels that he needs it.   Encouragement offered.   Q 15 safety checks continue.

## 2015-09-19 NOTE — ED Notes (Signed)
Pt AAO x 3, visiting with family at present. No distress noted, cooperative, restless.  Monitoring for safety, Q 15 min checks in effect.
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
## 2015-09-19 NOTE — ED Notes (Signed)
Patient has been able to process events that led to hospitalization.  Patient has become agreeable to taking medications and expresses the desire to continue with medication once discharged from the hospital.  Patient has been in a pleasant mood had no

## 2015-09-20 DIAGNOSIS — F312 Bipolar disorder, current episode manic severe with psychotic features: Secondary | ICD-10-CM | POA: Diagnosis not present

## 2015-09-20 MED ORDER — HALOPERIDOL 5 MG PO TABS: 5.0000 mg | ORAL_TABLET | Freq: Two times a day (BID) | ORAL | Status: DC

## 2015-09-20 MED ORDER — OXCARBAZEPINE 300 MG PO TABS: 300.0000 mg | ORAL_TABLET | Freq: Two times a day (BID) | ORAL | Status: DC

## 2015-09-20 MED ORDER — TRAZODONE HCL 100 MG PO TABS: 100.0000 mg | ORAL_TABLET | Freq: Every day | ORAL | Status: AC

## 2015-09-20 MED ORDER — AMANTADINE HCL 100 MG PO CAPS: 100.0000 mg | ORAL_CAPSULE | Freq: Two times a day (BID) | ORAL | Status: DC
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
## 2015-09-20 NOTE — ED Notes (Signed)
Patient discharged home per MD order.  Patient received all personal belongings. Reviewed discharge instructions.  Patient given prescriptions and will follow up with [facility].  Patient denies SI/HI/AVH.  Patient indicated understanding.

## 2015-09-20 NOTE — BH Assessment (Signed)
BHH Assessment Progress Note  At 09:55 Alla GermanGilchrist at [facility] pt remains on their wait list.  Doylene Canninghomas Nivia Gervase, MA Triage Specialist [phone]
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
## 2015-10-29 ENCOUNTER — Emergency Department (HOSPITAL_COMMUNITY)
Admission: EM | Admit: 2015-10-29 | Discharge: 2015-11-04 | Disposition: A | Discharge status: Other Acute Inpatient Hospital | Payer: Medicaid Other | Attending: Emergency Medicine | Admitting: Emergency Medicine

## 2015-10-29 ENCOUNTER — Encounter (HOSPITAL_COMMUNITY): Payer: Self-pay | Admitting: Emergency Medicine

## 2015-10-29 DIAGNOSIS — F1220 Cannabis dependence, uncomplicated: Secondary | ICD-10-CM | POA: Diagnosis present

## 2015-10-29 DIAGNOSIS — R45851 Suicidal ideations: Secondary | ICD-10-CM | POA: Insufficient documentation

## 2015-10-29 DIAGNOSIS — Z79899 Other long term (current) drug therapy: Secondary | ICD-10-CM | POA: Insufficient documentation

## 2015-10-29 DIAGNOSIS — F312 Bipolar disorder, current episode manic severe with psychotic features: Secondary | ICD-10-CM | POA: Diagnosis not present

## 2015-10-29 DIAGNOSIS — Z046 Encounter for general psychiatric examination, requested by authority: Secondary | ICD-10-CM | POA: Diagnosis present

## 2015-10-29 DIAGNOSIS — Z8781 Personal history of (healed) traumatic fracture: Secondary | ICD-10-CM | POA: Insufficient documentation

## 2015-10-29 DIAGNOSIS — R443 Hallucinations, unspecified: Secondary | ICD-10-CM | POA: Diagnosis not present

## 2015-10-29 DIAGNOSIS — F319 Bipolar disorder, unspecified: Secondary | ICD-10-CM | POA: Diagnosis not present

## 2015-10-29 DIAGNOSIS — Z87891 Personal history of nicotine dependence: Secondary | ICD-10-CM | POA: Insufficient documentation

## 2015-10-29 DIAGNOSIS — R45850 Homicidal ideations: Secondary | ICD-10-CM | POA: Insufficient documentation

## 2015-10-29 DIAGNOSIS — F1210 Cannabis abuse, uncomplicated: Secondary | ICD-10-CM | POA: Insufficient documentation

## 2015-10-29 LAB — COMPREHENSIVE METABOLIC PANEL
ALBUMIN: 4.5 g/dL (ref 3.5–5.0)
ALK PHOS: 82 U/L (ref 38–126)
ALT: 59 U/L (ref 17–63)
ANION GAP: 11 (ref 5–15)
AST: 87 U/L — ABNORMAL HIGH (ref 15–41)
BILIRUBIN TOTAL: 0.6 mg/dL (ref 0.3–1.2)
BUN: 16 mg/dL (ref 6–20)
CALCIUM: 9.2 mg/dL (ref 8.9–10.3)
CO2: 26 mmol/L (ref 22–32)
Chloride: 107 mmol/L (ref 101–111)
Creatinine, Ser: 0.79 mg/dL (ref 0.61–1.24)
GFR calc Af Amer: >60 mL/min (ref >60)
GLUCOSE: 81 mg/dL (ref 65–99)
POTASSIUM: 4.1 mmol/L (ref 3.5–5.1)
Sodium: 144 mmol/L (ref 135–145)
TOTAL PROTEIN: 7.3 g/dL (ref 6.5–8.1)

## 2015-10-29 LAB — RAPID URINE DRUG SCREEN, HOSP PERFORMED
Amphetamines: NOT DETECTED
BARBITURATES: NOT DETECTED
Benzodiazepines: NOT DETECTED
COCAINE: NOT DETECTED
Opiates: NOT DETECTED
Tetrahydrocannabinol: POSITIVE — AB

## 2015-10-29 LAB — CBC
HEMATOCRIT: 39 % (ref 39.0–52.0)
Hemoglobin: 13 g/dL (ref 13.0–17.0)
MCH: 32.3 pg (ref 26.0–34.0)
MCHC: 33.3 g/dL (ref 30.0–36.0)
MCV: 97 fL (ref 78.0–100.0)
PLATELETS: 236 10*3/uL (ref 150–400)
RBC: 4.02 MIL/uL — ABNORMAL LOW (ref 4.22–5.81)
RDW: 12.8 % (ref 11.5–15.5)
WBC: 12 10*3/uL — AB (ref 4.0–10.5)

## 2015-10-29 LAB — SALICYLATE LEVEL: Salicylate Lvl: <4 mg/dL (ref 2.8–30.0)

## 2015-10-29 LAB — ACETAMINOPHEN LEVEL

## 2015-10-29 LAB — ETHANOL

## 2015-10-29 MED ORDER — OXCARBAZEPINE 300 MG PO TABS
300.0000 mg | ORAL_TABLET | Freq: Two times a day (BID) | ORAL | Status: DC
  Administered 2015-10-29 – 2015-11-01 (×6): 300 mg via ORAL
  Filled 2015-10-29 (×5): qty 1

## 2015-10-29 MED ORDER — AMANTADINE HCL 100 MG PO CAPS
100.0000 mg | ORAL_CAPSULE | Freq: Two times a day (BID) | ORAL | Status: DC
  Administered 2015-10-31 – 2015-11-03 (×3): 100 mg via ORAL
  Filled 2015-10-29 (×13): qty 1

## 2015-10-29 MED ORDER — TRAZODONE HCL 100 MG PO TABS
100.0000 mg | ORAL_TABLET | Freq: Every day | ORAL | Status: DC
  Administered 2015-10-29 – 2015-11-02 (×4): 100 mg via ORAL
  Filled 2015-10-29 (×7): qty 1

## 2015-10-29 MED ORDER — HALOPERIDOL 5 MG PO TABS
5.0000 mg | ORAL_TABLET | Freq: Two times a day (BID) | ORAL | Status: DC
  Administered 2015-10-29: 5 mg via ORAL
  Filled 2015-10-29: qty 1

## 2015-10-29 MED ORDER — IBUPROFEN 800 MG PO TABS: 800.0000 mg | ORAL_TABLET | Freq: Once | ORAL | Status: DC

## 2015-10-29 MED ORDER — BENZTROPINE MESYLATE 1 MG PO TABS
0.5000 mg | ORAL_TABLET | Freq: Two times a day (BID) | ORAL | Status: DC
  Administered 2015-10-29: 0.5 mg via ORAL
  Filled 2015-10-29: qty 1

## 2015-10-29 NOTE — BH Assessment (Signed)
Patient appeared to be drowsy and dozing off after every question. Patient would wake up and make a random statement and then go back to sleep. Patient told nurse that he got a shot. Contacted Cheri FowlerKayla Rose, PA-C who states that no orders were provided for patient to have any IM medications. . Reviewed patients MAR and there are no orders recorded at this time.   Davina PokeJoVea Garen Woolbright, LCSW Therapeutic Triage Specialist [facility] 10/29/2015 5:12 PM

## 2015-10-29 NOTE — ED Notes (Signed)
Pt resting on stretcher. AAO x 3, no distress noted, calm & cooperative.  Monitoring for safety, Q 15 min checks in effect.

## 2015-10-29 NOTE — ED Notes (Signed)
Pt states "I'm the VP."

## 2015-10-29 NOTE — ED Notes (Signed)
Pt oriented to room and unit.  He is very irritable and refusing to take part in assessment.  He refused to sign his belonging sheet and pulled covers over his head.  15 minute checks and video monitoring continue.

## 2015-10-29 NOTE — ED Provider Notes (Signed)
CSN: [id number]     Arrival date & time 10/29/15  1215 History   First MD Initiated Contact with Patient 10/29/15 1407     Chief Complaint  Patient presents with  . IVC      (Consider location/radiation/quality/duration/timing/severity/associated sxs/prior Treatment) HPI   Rodney Chandler is a 30 y.o. male with PMH significant for Bipolar disorder, manic depression, schizophrenia who presents with medical clearance for IVC.  Patient reports hearing voices telling him to rob and kill people and he will go and knock on doors at night.  He is not on any medications.  Patient's mother obtained IVC paperwork due to fear that the patient will hurt himself or others.  Per mother, patient uses marijuana daily.  Currently, patient denies SI or HI and reports that his mother always tried to IVC him, and that she does not have his best interest at hand.  He reports he is compliant with his medications.  He repeatedly reports that he needs to get out because he has a baby on the way and needs to get prepared.  He states that he is ready to go home, and would like to see the psych doctor. He is ambulatory.   Past Medical History  Diagnosis Date  . Bipolar 1 disorder (HCC)   . Manic depression (HCC)   . Schizophrenia (HCC)   . Mandibular fracture (HCC) 09/17/2013   Past Surgical History  Procedure Laterality Date  . Orif mandibular fracture N/A 09/17/2013    Procedure: OPEN REDUCTION INTERNAL FIXATION (ORIF) MANDIBULAR FRACTURE;  Surgeon: Darletta MollSui W Teoh, MD;  [facility];  Service: ENT;  Laterality: N/A;  . Mouth surgery    . Mandibular hardware removal N/A 10/18/2013    Procedure: MANDIBULAR HARDWARE REMOVAL;  Surgeon: Darletta MollSui W Teoh, MD;  Location: [facility];  Service: ENT;  Laterality: N/A;   History reviewed. No pertinent family history. Social History  Substance Use Topics  . Smoking status: Former Smoker -- 0.50 packs/day    Types: Cigarettes  . Smokeless tobacco: Never Used  .  Alcohol Use: Yes     Comment: Weekends.     Review of Systems All other systems negative unless otherwise stated in HPI    Allergies  Lithium; Depakote; and Zyprexa  Home Medications   Prior to Admission medications   Medication Sig Start Date End Date Taking? Authorizing Provider  benztropine (COGENTIN) 0.5 MG tablet Take 0.5 mg by mouth 2 (two) times daily.   Yes Historical Provider, MD  haloperidol (HALDOL) 5 MG tablet Take 1 tablet (5 mg total) by mouth 2 (two) times daily. 09/20/15  Yes Earney NavyJosephine C Onuoha, NP  traZODone (DESYREL) 100 MG tablet Take 1 tablet (100 mg total) by mouth at bedtime. 09/20/15  Yes Earney NavyJosephine C Onuoha, NP  amantadine (SYMMETREL) 100 MG capsule Take 1 capsule (100 mg total) by mouth 2 (two) times daily. Patient not taking: Reported on 10/29/2015 09/20/15   Earney NavyJosephine C Onuoha, NP  Oxcarbazepine (TRILEPTAL) 300 MG tablet Take 1 tablet (300 mg total) by mouth 2 (two) times daily. Patient not taking: Reported on 10/29/2015 09/20/15   Earney NavyJosephine C Onuoha, NP   BP 157/130 mmHg  Pulse 62  Temp(Src) 98 F (36.7 C) (Oral)  Resp 16  SpO2 98% Physical Exam  Constitutional: He is oriented to person, place, and time. He appears well-developed and well-nourished.  HENT:  Head: Normocephalic and atraumatic.  Mouth/Throat: Oropharynx is clear and moist.  Eyes: Conjunctivae are normal. Pupils  are equal, round, and reactive to light.  Neck: Normal range of motion. Neck supple.  Cardiovascular: Normal rate, regular rhythm and normal heart sounds.   No murmur heard. Pulmonary/Chest: Effort normal and breath sounds normal. No accessory muscle usage or stridor. No respiratory distress. He has no wheezes. He has no rhonchi. He has no rales.  Abdominal: Soft. Bowel sounds are normal. He exhibits no distension. There is no tenderness.  Musculoskeletal: Normal range of motion. He exhibits no tenderness.       Right foot: Normal. There is normal range of motion, no tenderness, no  bony tenderness, no swelling, normal capillary refill, no crepitus, no deformity and no laceration.  Lymphadenopathy:    He has no cervical adenopathy.  Neurological: He is alert and oriented to person, place, and time.  Speech clear without dysarthria.  Strength and sensation intact bilaterally throughout lower extremities.   Skin: Skin is warm and dry.  Psychiatric: His affect is labile. His speech is rapid and/or pressured. He is agitated, hyperactive and actively hallucinating (responding to people that are not present in room). He expresses impulsivity and inappropriate judgment. He expresses homicidal (reported) and suicidal (reported) ideation.    ED Course  Procedures (including critical care time) Labs Review Labs Reviewed  COMPREHENSIVE METABOLIC PANEL - Abnormal; Notable for the following:    AST 87 (*)    All other components within normal limits  ACETAMINOPHEN LEVEL - Abnormal; Notable for the following:    Acetaminophen (Tylenol), Serum <10 (*)    All other components within normal limits  CBC - Abnormal; Notable for the following:    WBC 12.0 (*)    RBC 4.02 (*)    All other components within normal limits  URINE RAPID DRUG SCREEN, HOSP PERFORMED - Abnormal; Notable for the following:    Tetrahydrocannabinol POSITIVE (*)    All other components within normal limits  ETHANOL  SALICYLATE LEVEL    Imaging Review No results found. I have personally reviewed and evaluated these images and lab results as part of my medical decision-making.   EKG Interpretation None      MDM   Final diagnoses:  Homicidal ideation  Suicidal ideation    Pt presents to the ED for IVC with Suicidal Ideation/Homocidal Ideation.The patients behavior problems are bipolar disorder, manic depression, and schizophrenia. The patient currently does not have any acute physical complaints and is in no acute distress. He complains of right foot pain without injury.  Non-TTP on exam, NVI.   Patient ambulatory without difficulty.  No indication for imaging.  Motrin for pain.  The patients demeanor is liable. The patient was brought to ED by family. The patient is here by IVC.  Labs unremarkable.  UDS positive for THC.  TTS has been consulted, and psych hold has been placed. Patient moved to [facility]. Case has been discussed with Dr. Criss Alvine who agrees with the above plan.Cheri Fowler, PA-C 10/29/15 1549  Pricilla Loveless, MD 10/29/15 1556

## 2015-10-29 NOTE — BH Assessment (Signed)
Assessment completed.  Consulted with Fransisca KaufmannLaura Davis, NP who recommends patient be observed overnight and evaluated in the morning my psychiatry.    Davina PokeJoVea Kaylena Pacifico, LCSW Therapeutic Triage Specialist [facility] 10/29/2015 5:33 PM

## 2015-10-29 NOTE — ED Notes (Signed)
Pt adds that his wife will confirm his story. Angelic Kathlene Novemberarson can be reached at [phone].

## 2015-10-29 NOTE — BH Assessment (Addendum)
Assessment Note  Rodney Chandler is an 30 y.o. male presenting to [facility] under IVC by his mother. Patients IVC states:  "The rWestley Hummerespondent  Is telling the petitioner that he is hearing voices that are telling him to rob and kill people. The respondent is knocking on doors at night. He has been diagnosed with bipolar, schizophrenia, and manic depression. The respondent does not take his medications. The petitioner is afraid that the respondent will hurt someone or someone will hurt him. The respondent is abusing marijuana on a daily basis."  Patient was drowsy and unable to answer all questions of the assessment. Patient was not oriented and was referring to himself as "Rodney Chandler" when asked his name. Patient states that his birthday is "[birth year]" but was unable to provide a year. Patient not oriented to location. 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 Patient has to be woken up several times during assessment. Patient denies Si and states that he has never attempted to harm himself. Patient denies self injurious behavior. Patient denies access to firearms or weapons. Patient denies HI. Patient states that he has a court date but is unable to identify for what reason or the date. When asked if he is on probation, patient responds "I'm about to be." Patient denies history of trauma./abuse.  Patient states that he "gets high everyday" smoking THC and did not specify if he uses other drugs or drinks alcohol.  Patient did not respond to question regarding AVH and did not respond when his name was called several times. Patient appears to delusional and woke up stating "I need to get Rodney Chandler in here" and went back to sleep.   Patients mother Rodney Chandler, the petitioner, was contacted at [phone] and her mother answered the phone and stated that she was not available at this time.   Consulted with Rodney KaufmannLaura Davis, NP who recommends patient be observed  overnight and evaluated in the morning.    Diagnosis: Cannabis-induced psychotic disorder, With mild use disorder  Past Medical History:  Past Medical History  Diagnosis Date  . Bipolar 1 disorder (HCC)   . Manic depression (HCC)   . Schizophrenia (HCC)   . Mandibular fracture (HCC) 09/17/2013    Past Surgical History  Procedure Laterality Date  . Orif mandibular fracture N/A 09/17/2013    Procedure: OPEN REDUCTION INTERNAL FIXATION (ORIF) MANDIBULAR FRACTURE;  Surgeon: Darletta MollSui W Teoh, MD;  [facility];  Service: ENT;  Laterality: N/A;  . Mouth surgery    . Mandibular hardware removal N/A 10/18/2013    Procedure: MANDIBULAR HARDWARE REMOVAL;  Surgeon: Darletta MollSui W Teoh, MD;  Location: [facility];  Service: ENT;  Laterality: N/A;    Family History: History reviewed. No pertinent family history.  Social History:  reports that he has quit smoking. His smoking use included Cigarettes. He smoked 0.50 packs per day. He has never used smokeless tobacco. He reports that he drinks alcohol. He reports that he uses illicit drugs (Marijuana) about 14 times per week.  Additional Social History:  Alcohol / Drug Use Pain Medications: See PTA Prescriptions: See PTA Over the Counter: See PTA History of alcohol / drug use?: Yes Longest period of sobriety (when/how long):  (UTA) Negative Consequences of Use:  (UTA) Substance #1 Name of Substance 1: THC 1 - Age of First Use: UTA 1 - Amount (size/oz): UTA 1 - Frequency: UTA 1 - Duration: UTA 1 - Last Use /  Amount: UTA  CIWA: CIWA-Ar BP: 106/69 mmHg Pulse Rate: (!) 59 COWS:    Allergies:  Allergies  Allergen Reactions  . Lithium Other (See Comments)    NOSEBLEED  . Depakote [Divalproex Sodium] Other (See Comments)    NOSEBLEED  . Zyprexa [Olanzapine] Other (See Comments)    NOSEBLEED    Home Medications:  (Not in a hospital admission)  OB/GYN Status:  No LMP for male patient.  General Assessment Data Location of  Assessment: [facility] TTS Assessment: In system Is this a Tele or Face-to-Face Assessment?: Face-to-Face Is this an Initial Assessment or a Re-assessment for this encounter?: Initial Assessment Marital status:  (UTA) Living Arrangements: Other (Comment) (patient reports he is homeless) Can pt return to current living arrangement?: Yes Admission Status: Involuntary Is patient capable of signing voluntary admission?: Yes Referral Source: Other (IVC)     Crisis Care Plan Living Arrangements: Other (Comment) (patient reports he is homeless) Name of Psychiatrist:  (UTA) Name of Therapist:  Industrial/product designer)  Education Status Is patient currently in school?: No Highest grade of school patient has completed: 9th  Risk to self with the past 6 months Suicidal Ideation: No Has patient been a risk to self within the past 6 months prior to admission? : No Suicidal Intent: No Has patient had any suicidal intent within the past 6 months prior to admission? : No Is patient at risk for suicide?: No Suicidal Plan?: No Has patient had any suicidal plan within the past 6 months prior to admission? : No Access to Means: No What has been your use of drugs/alcohol within the last 12 months?: THC daily Previous Attempts/Gestures: No How many times?: 0 Other Self Harm Risks: Denies Triggers for Past Attempts: None known Intentional Self Injurious Behavior: None Family Suicide History: No Recent stressful life event(s):  (UTA) Persecutory voices/beliefs?:  (UTA) Depression:  (patient denies) Depression Symptoms:  (Patient denies) Substance abuse history and/or treatment for substance abuse?: Yes Suicide prevention information given to non-admitted patients: Not applicable  Risk to Others within the past 6 months Homicidal Ideation: No Does patient have any lifetime risk of violence toward others beyond the six months prior to admission? : No Thoughts of Harm to Others: No Current Homicidal Intent:  No Current Homicidal Plan: No Access to Homicidal Means: No Identified Victim: N/A History of harm to others?: No Assessment of Violence: None Noted Violent Behavior Description: Denies Does patient have access to weapons?: No Criminal Charges Pending?: Yes Describe Pending Criminal Charges:  (UTA) Does patient have a court date: Yes Court Date:  (UTA) Is patient on probation?: Unknown  Psychosis Hallucinations: None noted Delusions: Unspecified  Mental Status Report Appearance/Hygiene: In scrubs Eye Contact: Poor Motor Activity: Unremarkable Speech: Word salad Level of Consciousness: Drowsy Mood: Labile Affect: Labile Anxiety Level: None Thought Processes: Flight of Ideas Judgement: Impaired Orientation: Not oriented Obsessive Compulsive Thoughts/Behaviors: None  Cognitive Functioning Concentration: Unable to Assess Memory: Unable to Assess IQ: Average Insight: Unable to Assess Impulse Control: Unable to Assess Appetite: Good Sleep: No Change Total Hours of Sleep: 7 Vegetative Symptoms: None  ADLScreening [facility] Assessment Services) Patient's cognitive ability adequate to safely complete daily activities?: Yes Patient able to express need for assistance with ADLs?: Yes Independently performs ADLs?: Yes (appropriate for developmental age)  Prior Inpatient Therapy Prior Inpatient Therapy: Yes Prior Therapy Dates:  (UTA) Prior Therapy Facilty/Provider(s):  (UTA) Reason for Treatment:  (UTA)  Prior Outpatient Therapy Prior Outpatient Therapy:  (UTA) Prior Therapy Dates:  (UTA)  Prior Therapy Facilty/Provider(s):  (UTA) Reason for Treatment:  (UTA) Does patient have an ACCT team?: Unknown Does patient have Intensive In-House Services?  : Unknown Does patient have [facility] services? : Unknown Does patient have P4CC services?: Unknown  ADL Screening (condition at time of admission) Patient's cognitive ability adequate to safely complete daily activities?:  Yes Is the patient deaf or have difficulty hearing?: No Does the patient have difficulty seeing, even when wearing glasses/contacts?: No Does the patient have difficulty concentrating, remembering, or making decisions?: No Patient able to express need for assistance with ADLs?: Yes Does the patient have difficulty dressing or bathing?: No Independently performs ADLs?: Yes (appropriate for developmental age) Does the patient have difficulty walking or climbing stairs?: No Weakness of Legs: None Weakness of Arms/Hands: None  Home Assistive Devices/Equipment Home Assistive Devices/Equipment: None  Therapy Consults (therapy consults require a physician order) PT Evaluation Needed: No OT Evalulation Needed: No Abuse/Neglect Assessment (Assessment to be complete while patient is alone) Physical Abuse: Denies Verbal Abuse: Denies Sexual Abuse: Denies Exploitation of patient/patient's resources: Denies Self-Neglect: Denies Values / Beliefs Cultural Requests During Hospitalization: None Spiritual Requests During Hospitalization: None Consults Spiritual Care Consult Needed: No Social Work Consult Needed: No Merchant navy officer (For Healthcare) Does patient have an advance directive?: No Would patient like information on creating an advanced directive?: No - patient declined information    Additional Information 1:1 In Past 12 Months?: No CIRT Risk: No Elopement Risk: No Does patient have medical clearance?: Yes     Disposition:  Disposition Initial Assessment Completed for this Encounter: Yes Disposition of Patient: Other dispositions (observe overnight and evaluate in AM) Other disposition(s): Other (Comment)  On Site Evaluation by:   Reviewed with Physician:    Bryna Razavi 10/29/2015 6:25 PM
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
## 2015-10-29 NOTE — ED Notes (Addendum)
While TTS was trying to assess pt. He kept dosing off.  He stated that he got a bunch of meds in ED but when i looked in chart and contacted previous nurse, it was determined that he had no meds prior to arrival.  I retook vital signs and tried to talk to pt.  He was alert but groggy.

## 2015-10-29 NOTE — ED Notes (Addendum)
Per IVC papers, pt with Hx of bipolar 1 disorder, manic depression, and schizophrenia reports hearing voices telling him to rob and kill people, pt knocks on doors at night, does not take medications. Pt's mother obtained IVC papers due to fear that pt will hurt someone or that someone will hurt him. Mother of pt states pt uses marijuana daily.  Patient responding to people not present in room, laughing while alone in room.  In contrast to IVC note, pt denies SI/HI/AVH, states that his mother always tries to IVC him and that she has ill intentions towards him. Pt states he takes all of his medications and should not be IVCed, fears his mother's ill will towards him.

## 2015-10-30 ENCOUNTER — Inpatient Hospital Stay (HOSPITAL_COMMUNITY): Admission: AD | Admit: 2015-10-30 | Payer: Medicaid Other | Source: Intra-hospital | Admitting: Psychiatry

## 2015-10-30 DIAGNOSIS — F312 Bipolar disorder, current episode manic severe with psychotic features: Secondary | ICD-10-CM | POA: Diagnosis not present

## 2015-10-30 DIAGNOSIS — R45850 Homicidal ideations: Secondary | ICD-10-CM | POA: Insufficient documentation

## 2015-10-30 DIAGNOSIS — F1220 Cannabis dependence, uncomplicated: Secondary | ICD-10-CM | POA: Diagnosis not present

## 2015-10-30 MED ORDER — DIPHENHYDRAMINE HCL 50 MG/ML IJ SOLN
50.0000 mg | Freq: Once | INTRAMUSCULAR | Status: AC
  Administered 2015-10-30 – 2015-10-31 (×2): 50 mg via INTRAMUSCULAR
  Filled 2015-10-30: qty 1

## 2015-10-30 MED ORDER — CHLORPROMAZINE HCL 25 MG PO TABS
50.0000 mg | ORAL_TABLET | Freq: Three times a day (TID) | ORAL | Status: DC
  Filled 2015-10-30 (×5): qty 2

## 2015-10-30 MED ORDER — ASENAPINE MALEATE 5 MG SL SUBL
10.0000 mg | SUBLINGUAL_TABLET | Freq: Two times a day (BID) | SUBLINGUAL | Status: DC | PRN
  Filled 2015-10-30 (×2): qty 2

## 2015-10-30 MED ORDER — LORAZEPAM 2 MG/ML IJ SOLN
2.0000 mg | Freq: Once | INTRAMUSCULAR | Status: AC
  Administered 2015-10-30: 2 mg via INTRAMUSCULAR
  Filled 2015-10-30: qty 1

## 2015-10-30 MED ORDER — TRAZODONE HCL 100 MG PO TABS: 100.0000 mg | ORAL_TABLET | Freq: Every day | ORAL | Status: DC

## 2015-10-30 MED ORDER — STERILE WATER FOR INJECTION IJ SOLN
INTRAMUSCULAR | Status: AC
  Filled 2015-10-30: qty 10

## 2015-10-30 MED ORDER — ZIPRASIDONE MESYLATE 20 MG IM SOLR
20.0000 mg | Freq: Once | INTRAMUSCULAR | Status: AC
  Administered 2015-10-30 – 2015-10-31 (×2): 20 mg via INTRAMUSCULAR
  Filled 2015-10-30: qty 20

## 2015-10-30 MED ORDER — DIPHENHYDRAMINE HCL 25 MG PO CAPS
50.0000 mg | ORAL_CAPSULE | Freq: Once | ORAL | Status: AC
  Administered 2015-10-30: 50 mg via ORAL
  Filled 2015-10-30: qty 2

## 2015-10-30 NOTE — ED Notes (Signed)
Pt cursing in hallway at present, redirected back to room.

## 2015-10-30 NOTE — ED Notes (Signed)
Pt cursing, pushing bedside table into wall, and slamming door. PA Karleen Hampshire called for med orders.  Security and [employer] at bedside for assistance.

## 2015-10-30 NOTE — ED Notes (Signed)
After speaking with MD pt became extremely angry and agitated.  He was yelling and cursing and hitting walls and slamming doors.  Security was called per MD order and prn order obtained.

## 2015-10-30 NOTE — ED Notes (Signed)
Patient refused vitals. Patient nurse notified. 

## 2015-10-30 NOTE — Consult Note (Signed)
[place] [facility] Consult   Reason for Consult: Agitation, aggression,  Medication non compliant Referring Physician:  EDP Patient Identification: Rodney Chandler MRN:  [id number] Principal Diagnosis: Bipolar affective disorder, current episode manic with psychotic symptoms [facility]) Diagnosis:   Patient Active Problem List   Diagnosis Date Noted  . Bipolar affective disorder, current episode manic with psychotic symptoms ([place]) [F31.2] 09/17/2015    Priority: High  . Cannabis use disorder, severe, dependence ([place]) [F12.20] 09/17/2015    Priority: High  . Mania ([place]) [F30.9]   . Concussion with loss of consciousness <= 30 min [S06.0X1A] 09/17/2013  . Acute respiratory failure ([place]) [J96.00] 09/17/2013  . 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 Medication management [Z79.899] 08/31/2013    Total Time spent with patient: 45 minutes  Subjective:   Rodney Chandler is a 30 y.o. male patient admitted with Agitation, aggression,   HPI:  AA male, 30 years old was evaluated today after he was IVC by his mother for not taking his MH medications.  Per IVC paper, patient reported to his mother that he was hearing voices that is telling him to rob a bank and kill people.  Document stated that patient is not taking his MH medications since his last visit to ER/[facility].   He was supposed to follow up with [facility] and did not do so.  Patient reports that he is on probation for hit and run and has a court date coming.  His speech is tangential and pressured.  Patient was angry towards providers and Nursing staff and threatening.  Patient was loud and followed providers to other Pstient's room using profane language.   He has been accepted for admission and we will be seeking placement at any facility with available bed.  He has been medicated for agitation with our emergency medications per protocol.  Past Psychiatric History:  Bipolar affective disorder, manic, Cannabis use  disorder  Risk to Self: Suicidal Ideation: No Suicidal Intent: No Is patient at risk for suicide?: No Suicidal Plan?: No Access to Means: No What has been your use of drugs/alcohol within the last 12 months?: THC daily How many times?: 0 Other Self Harm Risks: Denies Triggers for Past Attempts: None known Intentional Self Injurious Behavior: None Risk to Others: Homicidal Ideation: No Thoughts of Harm to Others: No Current Homicidal Intent: No Current Homicidal Plan: No Access to Homicidal Means: No Identified Victim: N/A History of harm to others?: No Assessment of Violence: None Noted Violent Behavior Description: Denies Does patient have access to weapons?: No Criminal Charges Pending?: Yes Describe Pending Criminal Charges:  (UTA) Does patient have a court date: Yes Court Date:  (UTA) Prior Inpatient Therapy: Prior Inpatient Therapy: Yes Prior Therapy Dates:  (UTA) Prior Therapy Facilty/Provider(s):  (UTA) Reason for Treatment:  (UTA) Prior Outpatient Therapy: Prior Outpatient Therapy:  (UTA) Prior Therapy Dates:  (UTA) Prior Therapy Facilty/Provider(s):  (UTA) Reason for Treatment:  (UTA) Does patient have an ACCT team?: Unknown Does patient have Intensive In-House Services?  : Unknown Does patient have [facility] services? : Unknown Does patient have P4CC services?: Unknown  Past Medical History:  Past Medical History  Diagnosis Date  . Bipolar 1 disorder ([place])   . Manic depression ([place])   . Schizophrenia ([place])   . Mandibular fracture ([place]) 09/17/2013    Past Surgical History  Procedure Laterality Date  . Orif mandibular fracture N/A 09/17/2013    Procedure: OPEN REDUCTION INTERNAL FIXATION (ORIF) MANDIBULAR FRACTURE;  Surgeon: Ascencion Dike,  MD;  Location: [place];  Service: ENT;  Laterality: N/A;  . Mouth surgery    . Mandibular hardware removal N/A 10/18/2013    Procedure: MANDIBULAR HARDWARE REMOVAL;  Surgeon: Ascencion Dike, MD;  Location: [place];   Service: ENT;  Laterality: N/A;   Family History: History reviewed. No pertinent family history.   Family Psychiatric  History:  Unable to obtain Social History:  History  Alcohol Use  . Yes    Comment: Weekends.      History  Drug Use  . 14.00 per week  . Special: Marijuana    Comment: Last used: 3 hours ago    Social History   Social History  . Marital Status: Single    Spouse Name: N/A  . Number of Children: N/A  . Years of Education: N/A   Social History Main Topics  . Smoking status: Former Smoker -- 0.50 packs/day    Types: Cigarettes  . Smokeless tobacco: Never Used  . Alcohol Use: Yes     Comment: Weekends.   . Drug Use: 14.00 per week    Special: Marijuana     Comment: Last used: 3 hours ago  . Sexual Activity: Not Asked   Other Topics Concern  . None   Social History Narrative   Additional Social History:    Pain Medications: See PTA Prescriptions: See PTA Over the Counter: See PTA History of alcohol / drug use?: Yes Longest period of sobriety (when/how long):  (UTA) Negative Consequences of Use:  (UTA) Name of Substance 1: THC 1 - Age of First Use: UTA 1 - Amount (size/oz): UTA 1 - Frequency: UTA 1 - Duration: UTA 1 - Last Use / Amount: UTA    Allergies:   Allergies  Allergen Reactions  . Lithium Other (See Comments)    NOSEBLEED  . Depakote [Divalproex Sodium] Other (See Comments)    NOSEBLEED  . Zyprexa [Olanzapine] Other (See Comments)    NOSEBLEED    Labs:  Results for orders placed or performed during the hospital encounter of 10/29/15 (from the past 48 hour(s))  Comprehensive metabolic panel     Status: Abnormal   Collection Time: 10/29/15 12:52 PM  Result Value Ref Range   Sodium 144 135 - 145 mmol/L   Potassium 4.1 3.5 - 5.1 mmol/L   Chloride 107 101 - 111 mmol/L   CO2 26 22 - 32 mmol/L   Glucose, Bld 81 65 - 99 mg/dL   BUN 16 6 - 20 mg/dL   Creatinine, Ser 0.79 0.61 - 1.24 mg/dL   Calcium 9.2 8.9 - 10.3 mg/dL   Total  Protein 7.3 6.5 - 8.1 g/dL   Albumin 4.5 3.5 - 5.0 g/dL   AST 87 (H) 15 - 41 U/L   ALT 59 17 - 63 U/L   Alkaline Phosphatase 82 38 - 126 U/L   Total Bilirubin 0.6 0.3 - 1.2 mg/dL   GFR calc non Af Amer >60 >60 mL/min   GFR calc Af Amer >60 >60 mL/min    Comment: (NOTE) The eGFR has been calculated using the CKD EPI equation. This calculation has not been validated in all clinical situations. eGFR's persistently <60 mL/min signify possible Chronic Kidney Disease.    Anion gap 11 5 - 15  Ethanol (ETOH)     Status: None   Collection Time: 10/29/15 12:52 PM  Result Value Ref Range   Alcohol, Ethyl (B) <5 <5 mg/dL    Comment:  LOWEST DETECTABLE LIMIT FOR SERUM ALCOHOL IS 5 mg/dL FOR MEDICAL PURPOSES ONLY   Salicylate level     Status: None   Collection Time: 10/29/15 12:52 PM  Result Value Ref Range   Salicylate Lvl <6.1 2.8 - 30.0 mg/dL  Acetaminophen level     Status: Abnormal   Collection Time: 10/29/15 12:52 PM  Result Value Ref Range   Acetaminophen (Tylenol), Serum <10 (L) 10 - 30 ug/mL    Comment:        THERAPEUTIC CONCENTRATIONS VARY SIGNIFICANTLY. A RANGE OF 10-30 ug/mL MAY BE AN EFFECTIVE CONCENTRATION FOR MANY PATIENTS. HOWEVER, SOME ARE BEST TREATED AT CONCENTRATIONS OUTSIDE THIS RANGE. ACETAMINOPHEN CONCENTRATIONS >150 ug/mL AT 4 HOURS AFTER INGESTION AND >50 ug/mL AT 12 HOURS AFTER INGESTION ARE OFTEN ASSOCIATED WITH TOXIC REACTIONS.   CBC     Status: Abnormal   Collection Time: 10/29/15 12:52 PM  Result Value Ref Range   WBC 12.0 (H) 4.0 - 10.5 K/uL   RBC 4.02 (L) 4.22 - 5.81 MIL/uL   Hemoglobin 13.0 13.0 - 17.0 g/dL   HCT 39.0 39.0 - 52.0 %   MCV 97.0 78.0 - 100.0 fL   MCH 32.3 26.0 - 34.0 pg   MCHC 33.3 30.0 - 36.0 g/dL   RDW 12.8 11.5 - 15.5 %   Platelets 236 150 - 400 K/uL  Urine rapid drug screen (hosp performed) (Not at [facility])     Status: Abnormal   Collection Time: 10/29/15  1:38 PM  Result Value Ref Range   Opiates NONE DETECTED  NONE DETECTED   Cocaine NONE DETECTED NONE DETECTED   Benzodiazepines NONE DETECTED NONE DETECTED   Amphetamines NONE DETECTED NONE DETECTED   Tetrahydrocannabinol POSITIVE (A) NONE DETECTED   Barbiturates NONE DETECTED NONE DETECTED    Comment:        DRUG SCREEN FOR MEDICAL PURPOSES ONLY.  IF CONFIRMATION IS NEEDED FOR ANY PURPOSE, NOTIFY LAB WITHIN 5 DAYS.        LOWEST DETECTABLE LIMITS FOR URINE DRUG SCREEN Drug Class       Cutoff (ng/mL) Amphetamine      1000 Barbiturate      200 Benzodiazepine   607 Tricyclics       371 Opiates          300 Cocaine          300 THC              50     Current Facility-Administered Medications  Medication Dose Route Frequency Provider Last Rate Last Dose  . amantadine (SYMMETREL) capsule 100 mg  100 mg Oral BID Gloriann Loan, PA-C   100 mg at 10/29/15 2136  . benztropine (COGENTIN) tablet 0.5 mg  0.5 mg Oral BID Gloriann Loan, PA-C   0.5 mg at 10/29/15 2137  . haloperidol (HALDOL) tablet 5 mg  5 mg Oral BID Gloriann Loan, PA-C   5 mg at 10/29/15 2137  . ibuprofen (ADVIL,MOTRIN) tablet 800 mg  800 mg Oral Once Gloriann Loan, PA-C   800 mg at 10/29/15 1430  . Oxcarbazepine (TRILEPTAL) tablet 300 mg  300 mg Oral BID Gloriann Loan, PA-C   300 mg at 10/29/15 2137  . traZODone (DESYREL) tablet 100 mg  100 mg Oral QHS Kayla Rose, PA-C   100 mg at 10/30/15 0256  . traZODone (DESYREL) tablet 100 mg  100 mg Oral QHS Laverle Hobby, PA-C       Current Outpatient Prescriptions  Medication Sig Dispense Refill  .  benztropine (COGENTIN) 0.5 MG tablet Take 0.5 mg by mouth 2 (two) times daily.    . haloperidol (HALDOL) 5 MG tablet Take 1 tablet (5 mg total) by mouth 2 (two) times daily. 60 tablet 0  . traZODone (DESYREL) 100 MG tablet Take 1 tablet (100 mg total) by mouth at bedtime. 30 tablet 0  . amantadine (SYMMETREL) 100 MG capsule Take 1 capsule (100 mg total) by mouth 2 (two) times daily. (Patient not taking: Reported on 10/29/2015) 60 capsule 0  . Oxcarbazepine  (TRILEPTAL) 300 MG tablet Take 1 tablet (300 mg total) by mouth 2 (two) times daily. (Patient not taking: Reported on 10/29/2015) 60 tablet 0    Musculoskeletal: Strength & Muscle Tone: within normal limits Gait & Station: normal Patient leans: N/A  Psychiatric Specialty Exam: Review of Systems  Unable to perform ROS: mental acuity    Blood pressure 106/63, pulse 105, temperature 97.8 F (36.6 C), temperature source Oral, resp. rate 20, SpO2 99 %.There is no weight on file to calculate BMI.  General Appearance: Casual and Disheveled  Eye Contact::  Poor  Speech:  Pressured  Volume:  Normal  Mood:  Angry, Anxious, Euphoric and Irritable  Affect:  Congruent, Labile and Full Range  Thought Process:  Coherent, Loose and Tangential  Orientation:  Full (Time, Place, and Person)  Thought Content:  WDL  Suicidal Thoughts:  No  Homicidal Thoughts:  No  Memory:  Immediate;   Poor Recent;   Poor Remote;   Poor  Judgement:  Impaired  Insight:  Lacking and Shallow  Psychomotor Activity:  Increased  Concentration:  Poor  Recall:  NA  Fund of Knowledge:Poor  Language: Fair  Akathisia:  No  Handed:  Right  AIMS (if indicated):     Assets:  Desire for Improvement Housing  ADL's:  Impaired  Cognition: WNL  Sleep:      Treatment Plan Summary: Daily contact with patient to assess and evaluate symptoms and progress in treatment and Medication management  Disposition: Accepted for admission, we will be seeking placement at any facility with available beds.  We will resume his home medication.  Delfin Gant    PMHNP-BC 10/30/2015 10:52 AM Patient seen face-to-face for psychiatric evaluation, chart reviewed and case discussed with the physician extender and developed treatment plan. Reviewed the information documented and agree with the treatment plan. Corena Pilgrim, MD

## 2015-10-30 NOTE — ED Notes (Signed)
Patient appears restless, anxious. Denies SI, HI, AVH. States that he is "tired of being asked the same questions all the time". Upset with his mother and girlfriend(s). Fixated on "getting my money".   Encouragement offered.  Q 15 safety checks in place.

## 2015-10-30 NOTE — BH Assessment (Signed)
[facility] Assessment Progress Note  Per Thedore Mins, MD, this pt requires psychiatric hospitalization at this time.  Berneice Heinrich, RN, [facility] has assigned pt to [facility] Rm 500-1 with a caveat that pt is not to be transported until 21:30.  Pt is under IVC, and IVC documents have been faxed to [facility].  Pt's nurse, Kendal Hymen, has been notified, and agrees to send original paperwork along with pt via law enforcement, and to call report to [phone].  Doylene Canning, MA Triage Specialist [phone]

## 2015-10-31 MED ORDER — ZIPRASIDONE MESYLATE 20 MG IM SOLR
20.0000 mg | Freq: Once | INTRAMUSCULAR | Status: AC
  Administered 2015-10-31: 20 mg via INTRAMUSCULAR
  Filled 2015-10-31: qty 20

## 2015-10-31 MED ORDER — LORAZEPAM 2 MG/ML IJ SOLN
1.0000 mg | Freq: Once | INTRAMUSCULAR | Status: AC
  Administered 2015-11-01: 1 mg via INTRAMUSCULAR
  Filled 2015-10-31: qty 1

## 2015-10-31 MED ORDER — STERILE WATER FOR INJECTION IJ SOLN
INTRAMUSCULAR | Status: AC
  Administered 2015-10-31: 1.2 mL via INTRAMUSCULAR
  Filled 2015-10-31: qty 10

## 2015-10-31 MED ORDER — DIPHENHYDRAMINE HCL 50 MG/ML IJ SOLN
50.0000 mg | Freq: Once | INTRAMUSCULAR | Status: AC
  Administered 2015-10-31: 50 mg via INTRAMUSCULAR
  Filled 2015-10-31: qty 1

## 2015-10-31 MED ORDER — ZIPRASIDONE MESYLATE 20 MG IM SOLR
10.0000 mg | Freq: Once | INTRAMUSCULAR | Status: AC
  Administered 2015-10-31: 10 mg via INTRAMUSCULAR

## 2015-10-31 MED ORDER — ZIPRASIDONE MESYLATE 20 MG IM SOLR
INTRAMUSCULAR | Status: AC
Start: 2015-10-31 — End: 2015-10-31
  Administered 2015-10-31: 20 mg via INTRAMUSCULAR
  Filled 2015-10-31: qty 20

## 2015-10-31 MED ORDER — ZIPRASIDONE MESYLATE 20 MG IM SOLR
10.0000 mg | Freq: Once | INTRAMUSCULAR | Status: AC
  Administered 2015-10-31: 10 mg via INTRAMUSCULAR
  Filled 2015-10-31: qty 20

## 2015-10-31 MED ORDER — LORAZEPAM 2 MG/ML IJ SOLN
INTRAMUSCULAR | Status: AC
  Filled 2015-10-31: qty 1

## 2015-10-31 MED ORDER — LORAZEPAM 2 MG/ML IJ SOLN
2.0000 mg | Freq: Once | INTRAMUSCULAR | Status: AC
  Administered 2015-10-31: 2 mg via INTRAMUSCULAR

## 2015-10-31 MED ORDER — LORAZEPAM 2 MG/ML IJ SOLN
2.0000 mg | Freq: Once | INTRAMUSCULAR | Status: AC
  Administered 2015-10-31: 2 mg via INTRAMUSCULAR
  Filled 2015-10-31: qty 1

## 2015-10-31 MED ORDER — LORAZEPAM 1 MG PO TABS: 2.0000 mg | ORAL_TABLET | Freq: Once | ORAL | Status: AC

## 2015-10-31 MED ORDER — DIPHENHYDRAMINE HCL 50 MG/ML IJ SOLN
50.0000 mg | Freq: Once | INTRAMUSCULAR | Status: AC
  Administered 2015-10-31: 50 mg via INTRAMUSCULAR

## 2015-10-31 MED ORDER — DIPHENHYDRAMINE HCL 50 MG/ML IJ SOLN
INTRAMUSCULAR | Status: AC
Start: 2015-10-31 — End: 2015-10-31
  Administered 2015-10-31: 50 mg via INTRAMUSCULAR
  Filled 2015-10-31: qty 1

## 2015-10-31 NOTE — ED Notes (Signed)
Pt in 4 pt restraints, safety sitter at bedside.  Pt sedated at present, sleeping, spontaneous respirations noted. Skin color good, no distress noted.  Monitoring for safety, Q 15 min checks in effect.

## 2015-10-31 NOTE — ED Notes (Signed)
Patient aggressive, destroying property. Upset about getting injections. Cursing and threatening staff.

## 2015-10-31 NOTE — ED Notes (Signed)
Patient has been off/on the telephone most of the day.  He has bonded with another patient and was helping him dial the phone.  The other patient started dialing 911 and patient became agitated.  Phones had to be cut off; patient started cursing at the other patient.  He is now in his room resting quietly.

## 2015-10-31 NOTE — ED Notes (Signed)
Patient has been calm; he is currently talking on the phone.  Patient took his symmetrel and trileptal.  He refused his thorzaine stating, "I'm not taking that.  It knocks me out.  Why would I want to take that?"

## 2015-10-31 NOTE — B[facility] Counselor (Addendum)
This writer faxed out supporting documentation to the following facilities for placement of patient: [facility]  Mikayela Deats McNeil, MA 

## 2015-10-31 NOTE — ED Notes (Signed)
Patient states "I don't need a thousand pills". Patient reports he refuses multiple medications because "they be tearing my body down". States he is willing to take something for mood but does not want to be drowsy or sick. Patient refused all bedtime medications except Trileptal.

## 2015-10-31 NOTE — ED Provider Notes (Signed)
Called to [facility] for patient agitation. Patient was aggressive, punched the wall. He was given Geodon, Benadryl, Ativan for sedation. He continued to be at aggressive and required restraints. Patient examined and he has an abrasion over his dorsal hand he is not complaining of any pain in that area he has no significant bony tenderness on examination.  Tilden Fossa, MD 10/31/15 [phone]

## 2015-10-31 NOTE — ED Notes (Signed)
Patient threatening another patient from room 41. Pacing and aggressive.

## 2015-10-31 NOTE — ED Notes (Signed)
Patient became angry and slammed the phone down.  Phone has been shut

## 2015-10-31 NOTE — ED Provider Notes (Signed)
  Physical Exam  BP 117/69 mmHg  Pulse 98  Temp(Src) 98.1 F (36.7 C) (Oral)  Resp 18  SpO2 99%  Physical Exam  ED Course  Procedures  MDM Called for patient being aggressive enough in bed again to given Geodon Benadryl and Ativan for sedation since he has had this before. Required restraints due to his aggression.      Benjiman Core, MD 10/31/15 2223

## 2015-10-31 NOTE — ED Notes (Signed)
Patient resting comfortably at present.

## 2015-10-31 NOTE — ED Notes (Signed)
Pt resting at present, remains in 4 pt restraints, 1-1 sitter remains at bedside.  Security at bedside also.

## 2015-10-31 NOTE — Progress Notes (Signed)
Patient was on telephone and became agitated.  He slammed the phone down and proceeded to go to room.  Patient started punching the walls and attempting to pull the electrical outlets off the way.  He became threatening to staff stating, "I'm gonna to kill all of ya'll."  Patient kept attempting to get the police officer to fight him.  He states, "just bring it on.  I wanna go to jail.  I'm gonna fight you.  I'm not going down without a fight."  Patient kept requesting to go to jail and police officer said, "ok I'm gonna put cuffs on you."  Patient gave him his hands and cuffs were applied.  Patient then was put in a hold and taken to his bed and restraints were applied to wrist and legs.  Medication was then administered; 10 mg geodon; 2 mg ativan; 50 benedryl IM.  Patient tolerated well.  Patient kept trying to get out of the restraints.  The medication kicked in around 1720.  Patient currently in restraints and resting quietly.

## 2015-10-31 NOTE — Consult Note (Signed)
Psychiatric Specialty Exam: Physical Exam  ROS  Blood pressure 121/67, pulse 69, temperature 98.1 F (36.7 C), temperature source Oral, resp. rate 18, SpO2 100 %.There is no weight on file to calculate BMI.  General Appearance: Casual and Disheveled  Eye Contact:: Poor  Speech: Pressured  Volume: Normal  Mood: Angry, Anxious, Euphoric and Irritable  Affect: Congruent, Labile and Full Range  Thought Process: Coherent, Loose and Tangential  Orientation: Full (Time, Place, and Person)  Thought Content: WDL  Suicidal Thoughts: No  Homicidal Thoughts: No  Memory: Immediate; Poor Recent; Poor Remote; Poor  Judgement: Impaired  Insight: Lacking and Shallow  Psychomotor Activity: Increased  Concentration: Poor  Recall: NA  Fund of Knowledge:Poor  Language: Fair  Akathisia: No  Handed: Right  AIMS (if indicated):    Assets: Desire for Improvement Housing  ADL's: Impaired  Cognition: WNL          Patient remains agitated, was medicated with Emergency medications earlier this morning.  He was on 4 point restraint at the time for destroying the room.  Patient woke up this morning a little calm but requires inpatient hospitalization.   Patient constantly calls somebody and is very loud on the phone.  Patient denies SI/HI/AVH.  Bipolar affective disorder, current episode manic with psychotic symptoms ([facility])   Plan:  Continue  To offer medications, seek inpatient hospitalization.  We will continue to use our Emergency medications for severe agitation.  Dahlia Byes   PMHNP-BC Patient seen face-to-face for psychiatric evaluation, chart reviewed and case discussed with the physician extender and developed treatment plan. Reviewed the information documented and agree with the treatment plan. Thedore Mins, MD

## 2015-10-31 NOTE — ED Notes (Signed)
Pt combative, attempting to turn over stretcher, attempting to get out of restraints.  [employer] and security at bedside for assistance.  Dr Rubin Payor notified.

## 2015-10-31 NOTE — ED Notes (Signed)
Patient is currently lying in bed with eyes closed. Earlier, he got up and went to the bathroom.  No aggression noted at this time.

## 2015-11-01 MED ORDER — ZIPRASIDONE MESYLATE 20 MG IM SOLR
10.0000 mg | Freq: Once | INTRAMUSCULAR | Status: AC
  Administered 2015-11-01: 10 mg via INTRAMUSCULAR

## 2015-11-01 MED ORDER — STERILE WATER FOR INJECTION IJ SOLN
INTRAMUSCULAR | Status: AC
  Filled 2015-11-01: qty 10

## 2015-11-01 MED ORDER — DIPHENHYDRAMINE HCL 50 MG/ML IJ SOLN: 50.0000 mg | Freq: Once | INTRAMUSCULAR | Status: AC

## 2015-11-01 MED ORDER — ZIPRASIDONE MESYLATE 20 MG IM SOLR
INTRAMUSCULAR | Status: AC
  Filled 2015-11-01: qty 20

## 2015-11-01 MED ORDER — LORAZEPAM 2 MG/ML IJ SOLN: 1.0000 mg | Freq: Once | INTRAMUSCULAR | Status: AC

## 2015-11-01 MED ORDER — DIPHENHYDRAMINE HCL 50 MG/ML IJ SOLN
INTRAMUSCULAR | Status: AC
  Administered 2015-11-01: 13:00:00 via INTRAMUSCULAR
  Filled 2015-11-01: qty 1

## 2015-11-01 MED ORDER — LORAZEPAM 1 MG PO TABS: 2.0000 mg | ORAL_TABLET | Freq: Once | ORAL | Status: AC

## 2015-11-01 NOTE — ED Notes (Signed)
2 point wrist restraints removed, pt resting comfortably at present.  1-1 sitter at bedside.

## 2015-11-01 NOTE — ED Provider Notes (Signed)
Pt with aggressive behavior agitation needing chemical sedation while in restraints.  Pt has been de-escalated from four point to two point restraints.    Tilden Fossa, MD 11/01/15 [phone]

## 2015-11-01 NOTE — ED Notes (Signed)
Pt awake, alert & responsive, no distress noted, calm at present.  Talking on phone at present.  MOnitoring for safety, Q 15 min checks in effect.

## 2015-11-01 NOTE — ED Notes (Signed)
Pt refused post restraint vital signs, nonlabored respirations, no distress noted.

## 2015-11-01 NOTE — ED Notes (Addendum)
Bilateral ankle restraints removed for comfort and ROM.  Pt resting at present, 1-1 sitter remains at bedside. Restraints to bilateral wrists in place.

## 2015-11-01 NOTE — ED Notes (Signed)
Patient refused he said he not to come to his door

## 2015-11-01 NOTE — ED Notes (Signed)
Pt is very labile.  When we limited his phone calls he began yelling and cursing staff.  He tried to pull phone off of wall and yelled profanities about the phone.  He is non-compliant with meds. 15 minute checks and video monitoring continue.

## 2015-11-01 NOTE — ED Notes (Signed)
Patient attempted to snatch phone from the wall.

## 2015-11-02 DIAGNOSIS — F312 Bipolar disorder, current episode manic severe with psychotic features: Secondary | ICD-10-CM | POA: Diagnosis not present

## 2015-11-02 DIAGNOSIS — F1220 Cannabis dependence, uncomplicated: Secondary | ICD-10-CM | POA: Diagnosis not present

## 2015-11-02 MED ORDER — OXCARBAZEPINE 300 MG PO TABS
600.0000 mg | ORAL_TABLET | Freq: Two times a day (BID) | ORAL | Status: DC
  Administered 2015-11-02 – 2015-11-03 (×4): 600 mg via ORAL
  Filled 2015-11-02 (×5): qty 2

## 2015-11-02 MED ORDER — CHLORPROMAZINE HCL 25 MG PO TABS
100.0000 mg | ORAL_TABLET | Freq: Two times a day (BID) | ORAL | Status: DC
  Administered 2015-11-03: 100 mg via ORAL
  Filled 2015-11-02 (×3): qty 4

## 2015-11-02 MED ORDER — ZIPRASIDONE MESYLATE 20 MG IM SOLR
10.0000 mg | Freq: Once | INTRAMUSCULAR | Status: AC
  Administered 2015-11-02: 10 mg via INTRAMUSCULAR
  Filled 2015-11-02: qty 20

## 2015-11-02 NOTE — ED Notes (Addendum)
Pt. Noted in room. Pt. On stretcher with blanket over his head. Intermittently loudly talking.

## 2015-11-02 NOTE — ED Notes (Signed)
Report received from Jan RN. Pt. Alert and oriented in no acute distress denies SI, HI, AVH and pain.  Pt. Instructed to come to me with problems or concerns.Will continue to monitor for safety via security cameras and Q 15 minute checks.

## 2015-11-02 NOTE — ED Notes (Signed)
D:Pt has been pacing in front of the nurses station. Pt has been doing pushups and restless on the unit. He states "I feel like frosted flakes." Pt was looking at the security officers and said "You do not need security guards, I will protect you'll." A:Offered support and 15 minute checks. R:Safety maintained in the [facility].

## 2015-11-02 NOTE — Consult Note (Signed)
[facility] Face-to-Face Psychiatry Consult   Reason for Consult: Agitation, aggression,  Medication non compliant Referring Physician:  EDP Patient Identification: Rodney Chandler MRN:  [id number] Principal Diagnosis: Bipolar affective disorder, current episode manic with psychotic symptoms [facility]) Diagnosis:   Patient Active Problem List   Diagnosis Date Noted  . Homicidal ideation [R45.850]   . Mania (HCC) [F30.9]   . Bipolar affective disorder, current episode manic with psychotic symptoms (HCC) [F31.2] 09/17/2015  . Cannabis use disorder, severe, dependence (HCC) [F12.20] 09/17/2015  . Concussion with loss of consciousness <= 30 min [S06.0X1A] 09/17/2013  . Acute respiratory failure (HCC) [J96.00] 09/17/2013  . 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 Medication management [Z79.899] 08/31/2013    Total Time spent with patient: 25 minutes  Subjective:   Rodney Chandler is a 30 y.o. male patient admitted with agitation, aggression, and bizarre behavior. Pt seen and chart reviewed with NP/MD team this AM. Assessment was complicated by the pt's inability to focus without being severely intrusive. Patient continues to have command auditory hallucinations telling him to hurt others, including hospital staff. The patient's speech remains tangential and pressured. Patient reports that he needs to "get out of the hospital" and threatened provider during interview. Patient also reports wanting to "smoke pot" and asked if provider had any drugs on him. The patient was medicated yesterday with Geodon for continued agitation and psychosis. Seeking inpatient placement and adding patient to [facility] list.    HPI:  AA male, 30 years old was evaluated today after he was IVC by his mother for not taking his MH medications. Per IVC paper, patient reported to his mother that he was hearing voices that is telling him to rob a bank and kill people. Document stated that patient is not taking his MH  medications since his last visit to [facility]. He was supposed to follow up with [facility] and did not do so. Patient reports that he is on probation for hit and run and has a court date coming. His speech is tangential and pressured. Patient was angry towards providers and Nursing staff and threatening. Patient was loud and followed providers to other Pstient's room using profane language.  He has been accepted for admission and we will be seeking placement at any facility with available bed. He has been medicated for agitation with our emergency medications per protocol.  Past Psychiatric History:  Bipolar affective disorder, manic, Cannabis use disorder  Risk to Self: Suicidal Ideation: No Suicidal Intent: No Is patient at risk for suicide?: No Suicidal Plan?: No Access to Means: No What has been your use of drugs/alcohol within the last 12 months?: THC daily How many times?: 0 Other Self Harm Risks: Denies Triggers for Past Attempts: None known Intentional Self Injurious Behavior: None Risk to Others: Homicidal Ideation: No Thoughts of Harm to Others: No Current Homicidal Intent: No Current Homicidal Plan: No Access to Homicidal Means: No Identified Victim: N/A History of harm to others?: No Assessment of Violence: None Noted Violent Behavior Description: Denies Does patient have access to weapons?: No Criminal Charges Pending?: Yes Describe Pending Criminal Charges:  (UTA) Does patient have a court date: Yes Court Date:  (UTA) Prior Inpatient Therapy: Prior Inpatient Therapy: Yes Prior Therapy Dates:  (UTA) Prior Therapy Facilty/Provider(s):  (UTA) Reason for Treatment:  (UTA) Prior Outpatient Therapy: Prior Outpatient Therapy:  (UTA) Prior Therapy Dates:  (UTA) Prior Therapy Facilty/Provider(s):  (UTA) Reason for Treatment:  (UTA) Does patient have an ACCT team?: Unknown Does patient  have Intensive In-House Services?  : Unknown Does patient have [facility] services? :  Unknown Does patient have P4CC services?: Unknown  Past Medical History:  Past Medical History  Diagnosis Date  . Bipolar 1 disorder (HCC)   . Manic depression (HCC)   . Schizophrenia (HCC)   . Mandibular fracture (HCC) 09/17/2013    Past Surgical History  Procedure Laterality Date  . Orif mandibular fracture N/A 09/17/2013    Procedure: OPEN REDUCTION INTERNAL FIXATION (ORIF) MANDIBULAR FRACTURE;  Surgeon: Darletta Moll, MD;  Location: [facility] OR;  Service: ENT;  Laterality: N/A;  . Mouth surgery    . Mandibular hardware removal N/A 10/18/2013    Procedure: MANDIBULAR HARDWARE REMOVAL;  Surgeon: Darletta Moll, MD;  Location: [facility];  Service: ENT;  Laterality: N/A;   Family History: History reviewed. No pertinent family history.   Family Psychiatric  History:  Unable to obtain Social History:  History  Alcohol Use  . Yes    Comment: Weekends.      History  Drug Use  . 14.00 per week  . Special: Marijuana    Comment: Last used: 3 hours ago    Social History   Social History  . Marital Status: Single    Spouse Name: N/A  . Number of Children: N/A  . Years of Education: N/A   Social History Main Topics  . Smoking status: Former Smoker -- 0.50 packs/day    Types: Cigarettes  . Smokeless tobacco: Never Used  . Alcohol Use: Yes     Comment: Weekends.   . Drug Use: 14.00 per week    Special: Marijuana     Comment: Last used: 3 hours ago  . Sexual Activity: Not Asked   Other Topics Concern  . None   Social History Narrative   Additional Social History:    Pain Medications: See PTA Prescriptions: See PTA Over the Counter: See PTA History of alcohol / drug use?: Yes Longest period of sobriety (when/how long):  (UTA) Negative Consequences of Use:  (UTA) Name of Substance 1: THC 1 - Age of First Use: UTA 1 - Amount (size/oz): UTA 1 - Frequency: UTA 1 - Duration: UTA 1 - Last Use / Amount: UTA    Allergies:   Allergies  Allergen Reactions  .  Lithium Other (See Comments)    NOSEBLEED  . Depakote [Divalproex Sodium] Other (See Comments)    NOSEBLEED  . Zyprexa [Olanzapine] Other (See Comments)    NOSEBLEED    Labs:  No results found for this or any previous visit (from the past 48 hour(s)).  Current Facility-Administered Medications  Medication Dose Route Frequency Provider Last Rate Last Dose  . amantadine (SYMMETREL) capsule 100 mg  100 mg Oral BID Cheri Fowler, PA-C   100 mg at 11/02/15 1012  . asenapine (SAPHRIS) sublingual tablet 10 mg  10 mg Sublingual Q12H PRN Mojeed Akintayo      . chlorproMAZINE (THORAZINE) tablet 100 mg  100 mg Oral BID PC Mojeed Akintayo      . ibuprofen (ADVIL,MOTRIN) tablet 800 mg  800 mg Oral Once Cheri Fowler, PA-C   800 mg at 10/29/15 1430  . Oxcarbazepine (TRILEPTAL) tablet 600 mg  600 mg Oral BID Mojeed Akintayo   600 mg at 11/02/15 1012  . traZODone (DESYREL) tablet 100 mg  100 mg Oral QHS Cheri Fowler, PA-C   100 mg at 11/01/15 2115   Current Outpatient Prescriptions  Medication Sig Dispense Refill  . benztropine (  COGENTIN) 0.5 MG tablet Take 0.5 mg by mouth 2 (two) times daily.    . haloperidol (HALDOL) 5 MG tablet Take 1 tablet (5 mg total) by mouth 2 (two) times daily. 60 tablet 0  . traZODone (DESYREL) 100 MG tablet Take 1 tablet (100 mg total) by mouth at bedtime. 30 tablet 0  . amantadine (SYMMETREL) 100 MG capsule Take 1 capsule (100 mg total) by mouth 2 (two) times daily. (Patient not taking: Reported on 10/29/2015) 60 capsule 0  . Oxcarbazepine (TRILEPTAL) 300 MG tablet Take 1 tablet (300 mg total) by mouth 2 (two) times daily. (Patient not taking: Reported on 10/29/2015) 60 tablet 0    Musculoskeletal: Strength & Muscle Tone: within normal limits Gait & Station: normal Patient leans: N/A  Psychiatric Specialty Exam: Review of Systems  Unable to perform ROS: mental acuity  All other systems reviewed and are negative.   Blood pressure 127/87, pulse 98, temperature 98.2 F (36.8  C), temperature source Oral, resp. rate 20, SpO2 100 %.There is no weight on file to calculate BMI.  General Appearance: Casual and Disheveled  Eye Contact::  Poor  Speech:  Pressured  Volume:  Increased  Mood:  Angry, Anxious, Euphoric and Irritable  Affect:  Non-Congruent, Inappropriate, Labile and Full Range  Thought Process:  Circumstantial, Disorganized, Irrelevant and Tangential  Orientation:  Other:  Self, very manic, unable to accurately assess the rest of orientation  Thought Content:  Random, tangential, disorganized  Suicidal Thoughts:  No  Homicidal Thoughts:  No  Memory:  Immediate;   Poor Recent;   Poor Remote;   Poor  Judgement:  Impaired  Insight:  Lacking  Psychomotor Activity:  Increased  Concentration:  Poor  Recall:  NA  Fund of Knowledge:Poor  Language: Fair  Akathisia:  No  Handed:  Right  AIMS (if indicated):     Assets:  Resilience  ADL's:  Impaired  Cognition: WNL  Sleep:      Treatment Plan Summary: Daily contact with patient to assess and evaluate symptoms and progress in treatment and Medication management  -Crisis Stabilization  Medications  Amantadine  BID for EPS;  Saphris  q12hrs PRN for agitation and psychosis;  Thorazine  BID for psychosis;  Trileptal  BID for mood stabilization;  Trazadone  QHS for insomnia -Individual Counseling  Disposition: Continue to seek placement at any facility with available beds. Patient to be placed on [facility] list. Continue medications.  Beau Fanny, FNP-BC 11/02/2015 11:44 AM

## 2015-11-02 NOTE — ED Notes (Signed)
Pt. Up to rest room, then back to his room.

## 2015-11-02 NOTE — BH Assessment (Addendum)
[facility] Assessment Progress Note  This Clinical research associate has initiated [facility] referral.  At 10:32 I spoke to [facility] at the [facility] to obtain authorization; he provides authorization #[id number] from 11/02/2015 - 11/08/2015.  Please note that authorization does not mean that pt has been accepted to the facility.  At 10:37 I spoke to Hubbard at [facility], who took demographic information.  As of this writing, referral information is being faxed to [facility].  Doylene Canning, MA Triage Specialist [phone]   Addendum:  At 11:38 I spoke to Harl Bowie at [facility].  He reports that referral information has been received and is currently under review.  Doylene Canning, MA Triage Specialist [phone]   Addendum:  At 12:08 Vivien Rossetti calls from [facility].  Pt has been accepted to their wait list as a priority referral.  Doylene Canning, MA Triage Specialist [phone]

## 2015-11-02 NOTE — ED Notes (Signed)
Pt. Noted sleeping in room. No complaints or concerns voiced. No distress or abnormal behavior noted. Will continue to monitor with security cameras. Q 15 minute rounds continue. 

## 2015-11-02 NOTE — ED Notes (Signed)
Pt's mother came to visit and pt became agitated yelling and cursing. Pt's mother left within a few minutes. Pt reported that he wanted to see his mother before she came on the unit. After she left, pt's dinner tray arrived. Pt continued to curse and refused his evening medication and refused to eat. Pt slammed the door in his room. Pt said that he wanted to hit the mother f--ing doctor in the nose. Pt then went to his bed placing the cover over him.

## 2015-11-02 NOTE — ED Notes (Signed)
Pt. Physically threatening pt. In 36, panic button pushed to activate PD and security to prevent physical altercation. Pt. Yelling obscenities at PD and security.

## 2015-11-02 NOTE — ED Notes (Signed)
Pt. Walking around unit wrapped in a towel talking loudly.

## 2015-11-02 NOTE — ED Notes (Signed)
Pt. Yelling at pt. In 36, threatening to "beat his ass". Redirection attempted without results.

## 2015-11-02 NOTE — ED Notes (Signed)
Pt. Placed in his room by PD and security while continued threats and cursing.

## 2015-11-03 DIAGNOSIS — R45850 Homicidal ideations: Secondary | ICD-10-CM | POA: Diagnosis not present

## 2015-11-03 DIAGNOSIS — F312 Bipolar disorder, current episode manic severe with psychotic features: Secondary | ICD-10-CM

## 2015-11-03 MED ORDER — CLONAZEPAM 0.5 MG PO TABS: 0.5000 mg | ORAL_TABLET | Freq: Two times a day (BID) | ORAL | Status: DC

## 2015-11-03 MED ORDER — LORAZEPAM 2 MG/ML IJ SOLN: 2.0000 mg | Freq: Once | INTRAMUSCULAR | Status: DC

## 2015-11-03 MED ORDER — LORAZEPAM 2 MG/ML IJ SOLN
2.0000 mg | Freq: Once | INTRAMUSCULAR | Status: AC
  Administered 2015-11-03: 2 mg via INTRAMUSCULAR
  Filled 2015-11-03: qty 1

## 2015-11-03 MED ORDER — IBUPROFEN 200 MG PO TABS
600.0000 mg | ORAL_TABLET | Freq: Once | ORAL | Status: AC
  Administered 2015-11-03: 600 mg via ORAL
  Filled 2015-11-03: qty 3

## 2015-11-03 MED ORDER — DIPHENHYDRAMINE HCL 50 MG/ML IJ SOLN
50.0000 mg | Freq: Once | INTRAMUSCULAR | Status: AC
  Administered 2015-11-03: 50 mg via INTRAVENOUS
  Filled 2015-11-03: qty 1

## 2015-11-03 MED ORDER — CHLORPROMAZINE HCL 25 MG/ML IJ SOLN
50.0000 mg | Freq: Once | INTRAMUSCULAR | Status: AC
  Administered 2015-11-03: 50 mg via INTRAMUSCULAR
  Filled 2015-11-03: qty 2

## 2015-11-03 NOTE — ED Notes (Signed)
Up to the bathroom 

## 2015-11-03 NOTE — ED Notes (Addendum)
resting quielty, resp even and unlabored

## 2015-11-03 NOTE — ED Notes (Signed)
Up in  Hall/room, frustrated that his mother does not believe that he is trying and is taking his meds.  Pt denies that he wants to hurt anyone, talking w/ staff

## 2015-11-03 NOTE — ED Notes (Signed)
Pt. Seems to have responded to a request to keep the noise to a mnimum.

## 2015-11-03 NOTE — ED Notes (Signed)
Pt up in hall/room yelling, pacing, hitting his hand,  and cursing at the MD/NP.  Pt angry that he was not seen today and making verbal threats at the MD.  Multiple attempts by staff to redirect and descalate unsucessful , securilty and [employer] present.

## 2015-11-03 NOTE — ED Notes (Signed)
Pt. Noted sleeping in room. No complaints or concerns voiced. No distress or abnormal behavior noted. Will continue to monitor with security cameras. Q 15 minute rounds continue. 

## 2015-11-03 NOTE — ED Notes (Signed)
Up on the phone pt cooperative, polite

## 2015-11-03 NOTE — ED Notes (Signed)
Pt. Up to shower, talking loudly. Redirection attempted with limited results.

## 2015-11-03 NOTE — ED Notes (Signed)
Resting quietly, resp even and unlabored

## 2015-11-03 NOTE — ED Notes (Signed)
Pt. Noted in hall "rapping". No complaints or concerns voiced. No distress or abnormal behavior noted. Will continue to monitor with security cameras. Q 15 minute rounds continue.

## 2015-11-03 NOTE — ED Notes (Signed)
Up in hall talking w/ security guard

## 2015-11-03 NOTE — ED Notes (Signed)
Up in hall/ns talking w/pt's and staff,  Pleasant, redirectable

## 2015-11-03 NOTE — ED Notes (Signed)
Report received from Janie Rambo RN. Pt. Sleeping, respirations regular and unlabored. Will continue to monitor for safety via security cameras and Q 15 minute checks. 

## 2015-11-03 NOTE — Consult Note (Addendum)
[facility] Face-to-Face Psychiatry Consult   Reason for Consult: labile mood, severe agitation, threatening to hurt staff Referring Physician:  EDP Patient Identification: Rodney Chandler MRN:  [id number] Principal Diagnosis: Bipolar affective disorder, current episode manic with psychotic symptoms (HCC) Diagnosis:   Patient Active Problem List   Diagnosis Date Noted  . Bipolar affective disorder, current episode manic with psychotic symptoms (HCC) [F31.2] 09/17/2015    Priority: High  . Cannabis use disorder, severe, dependence (HCC) [F12.20] 09/17/2015    Priority: High  . Homicidal ideation [R45.850]   . Mania (HCC) [F30.9]   . Concussion with loss of consciousness <= 30 min [S06.0X1A] 09/17/2013  . Acute respiratory failure (HCC) [J96.00] 09/17/2013  . 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 Medication management [Z79.899] 08/31/2013    Total Time spent with patient: 25 minutes  Subjective:   Rodney Chandler is a 30 y.o. Male with history of Bipolar disorder who is non-compliant with medications. Patient is seen, chart reviewed,and case discussed with treatment team, Patient remains labile, severely agitated, aggressive, destroying hospital properties, pacing the hallways, yelling and screaming at the staff and threatening to hurt staff and me. Patient reports that he is angry towards the world and has been hearing voices telling him to rob the bank and hurt people. Patient has no insight and exercising poor judgment. Past Psychiatric History:    Risk to Self: Suicidal Ideation: No Suicidal Intent: No Is patient at risk for suicide?: No Suicidal Plan?: No Access to Means: No What has been your use of drugs/alcohol within the last 12 months?: THC daily How many times?: 0 Other Self Harm Risks: Denies Triggers for Past Attempts: None known Intentional Self Injurious Behavior: None Risk to Others: Homicidal Ideation: yes Thoughts of Harm to Others: yes Current  Homicidal Intent: No Current Homicidal Plan: No Access to Homicidal Means: No Identified Victim: N/A History of harm to others?: No Assessment of Violence: None Noted Violent Behavior Description: Denies Does patient have access to weapons?: No Criminal Charges Pending?: Yes Describe Pending Criminal Charges:  (UTA) Does patient have a court date: Yes Court Date:  (UTA) Prior Inpatient Therapy: Prior Inpatient Therapy: Yes Prior Therapy Dates:  (UTA) Prior Therapy Facilty/Provider(s):  (UTA) Reason for Treatment:  (UTA) Prior Outpatient Therapy: Prior Outpatient Therapy:  (UTA) Prior Therapy Dates:  (UTA) Prior Therapy Facilty/Provider(s):  (UTA) Reason for Treatment:  (UTA) Does patient have an ACCT team?: Unknown Does patient have Intensive In-House Services?  : Unknown Does patient have [facility] services? : Unknown Does patient have P4CC services?: Unknown  Past Medical History:  Past Medical History  Diagnosis Date  . Bipolar 1 disorder (HCC)   . Manic depression (HCC)   . Schizophrenia (HCC)   . Mandibular fracture (HCC) 09/17/2013    Past Surgical History  Procedure Laterality Date  . Orif mandibular fracture N/A 09/17/2013    Procedure: OPEN REDUCTION INTERNAL FIXATION (ORIF) MANDIBULAR FRACTURE;  Surgeon: Darletta Moll, MD;  Location: [facility] OR;  Service: ENT;  Laterality: N/A;  . Mouth surgery    . Mandibular hardware removal N/A 10/18/2013    Procedure: MANDIBULAR HARDWARE REMOVAL;  Surgeon: Darletta Moll, MD;  Location:  SURGERY CENTER;  Service: ENT;  Laterality: N/A;   Family History: History reviewed. No pertinent family history.   Family Psychiatric  History:  Unable to obtain Social History:  History  Alcohol Use  . Yes    Comment: Weekends.      History  Drug Use  .  14.00 per week  . Special: Marijuana    Comment: Last used: 3 hours ago    Social History   Social History  . Marital Status: Single    Spouse Name: N/A  . Number of Children: N/A   . Years of Education: N/A   Social History Main Topics  . Smoking status: Former Smoker -- 0.50 packs/day    Types: Cigarettes  . Smokeless tobacco: Never Used  . Alcohol Use: Yes     Comment: Weekends.   . Drug Use: 14.00 per week    Special: Marijuana     Comment: Last used: 3 hours ago  . Sexual Activity: Not Asked   Other Topics Concern  . None   Social History Narrative   Additional Social History:    Pain Medications: See PTA Prescriptions: See PTA Over the Counter: See PTA History of alcohol / drug use?: Yes Longest period of sobriety (when/how long):  (UTA) Negative Consequences of Use:  (UTA) Name of Substance 1: THC 1 - Age of First Use: UTA 1 - Amount (size/oz): UTA 1 - Frequency: UTA 1 - Duration: UTA 1 - Last Use / Amount: UTA    Allergies:   Allergies  Allergen Reactions  . Lithium Other (See Comments)    NOSEBLEED  . Depakote [Divalproex Sodium] Other (See Comments)    NOSEBLEED  . Zyprexa [Olanzapine] Other (See Comments)    NOSEBLEED    Labs:  No results found for this or any previous visit (from the past 48 hour(s)).  Current Facility-Administered Medications  Medication Dose Route Frequency Provider Last Rate Last Dose  . amantadine (SYMMETREL) capsule 100 mg  100 mg Oral BID Cheri Fowler, PA-C   100 mg at 11/03/15 0916  . asenapine (SAPHRIS) sublingual tablet 10 mg  10 mg Sublingual Q12H PRN Trisha Morandi      . chlorproMAZINE (THORAZINE) tablet 100 mg  100 mg Oral BID PC Quianna Avery   100 mg at 11/03/15 0916  . clonazePAM (KLONOPIN) tablet 0.5 mg  0.5 mg Oral BID Christal Lagerstrom      . ibuprofen (ADVIL,MOTRIN) tablet 800 mg  800 mg Oral Once Cheri Fowler, PA-C   800 mg at 10/29/15 1430  . Oxcarbazepine (TRILEPTAL) tablet 600 mg  600 mg Oral BID Rose Hegner   600 mg at 11/03/15 0916  . traZODone (DESYREL) tablet 100 mg  100 mg Oral QHS Cheri Fowler, PA-C   100 mg at 11/02/15 2105   Current Outpatient Prescriptions  Medication Sig  Dispense Refill  . benztropine (COGENTIN) 0.5 MG tablet Take 0.5 mg by mouth 2 (two) times daily.    . haloperidol (HALDOL) 5 MG tablet Take 1 tablet (5 mg total) by mouth 2 (two) times daily. 60 tablet 0  . traZODone (DESYREL) 100 MG tablet Take 1 tablet (100 mg total) by mouth at bedtime. 30 tablet 0  . amantadine (SYMMETREL) 100 MG capsule Take 1 capsule (100 mg total) by mouth 2 (two) times daily. (Patient not taking: Reported on 10/29/2015) 60 capsule 0  . Oxcarbazepine (TRILEPTAL) 300 MG tablet Take 1 tablet (300 mg total) by mouth 2 (two) times daily. (Patient not taking: Reported on 10/29/2015) 60 tablet 0    Musculoskeletal: Strength & Muscle Tone: within normal limits Gait & Station: normal Patient leans: N/A  Psychiatric Specialty Exam: Review of Systems  Unable to perform ROS: mental acuity  Constitutional: Negative.   HENT: Negative.   Eyes: Negative.   Respiratory: Negative.  Cardiovascular: Negative.   Gastrointestinal: Negative.   Genitourinary: Negative.   Musculoskeletal: Negative.   Skin: Negative.   Neurological: Negative.   Endo/Heme/Allergies: Negative.   Psychiatric/Behavioral: Positive for substance abuse. The patient has insomnia.   All other systems reviewed and are negative.   Blood pressure 113/63, pulse 56, temperature 97.8 F (36.6 C), temperature source Oral, resp. rate 18, SpO2 99 %.There is no weight on file to calculate BMI.  General Appearance: Casual and Disheveled  Eye Contact::  Poor  Speech:  Pressured  Volume:  Increased  Mood:  Angry, Anxious, Euphoric and Irritable  Affect:  Non-Congruent, Inappropriate, Labile and Full Range  Thought Process:  Circumstantial, Disorganized, Irrelevant and Tangential  Orientation:  Other:  Self, very manic, unable to accurately assess the rest of orientation  Thought Content:  Random, tangential, disorganized  Suicidal Thoughts:  No  Homicidal Thoughts:  Yes.  without intent/plan  Memory:  Immediate;    Poor Recent;   Poor Remote;   Poor  Judgement:  Impaired  Insight:  Lacking  Psychomotor Activity:  Increased  Concentration:  Poor  Recall:  NA  Fund of Knowledge:Poor  Language: Fair  Akathisia:  No  Handed:  Right  AIMS (if indicated):     Assets:  Resilience  ADL's:  Impaired  Cognition: WNL  Sleep:   poor   Treatment Plan Summary: Bipolar affective disorder, manic, Cannabis use disorder  Daily contact with patient to assess and evaluate symptoms and progress in treatment and Medication management  -Crisis Stabilization  Medications  Continue: -Amantadine  BID for EPS;  -Saphris  q12hrs PRN for agitation and psychosis;  -Thorazine  BID for psychosis;  -Increase Trileptal to  BID for mood stabilization;  -Trazadone  QHS for insomnia -Add Clonazepam 0.5mg  bid for agitation -Individual Counseling  Disposition: [facility] .  Thedore Mins, MD 11/03/2015 12:08 PM

## 2015-11-03 NOTE — ED Notes (Signed)
Pt. Noted in hall. No complaints or concerns voiced. No distress or abnormal behavior noted. Will continue to monitor with security cameras. Q 15 minute rounds continue. Pt. Admonished to keep his voice down.

## 2015-11-03 NOTE — ED Notes (Signed)
Pt medicated w/o difficulty, security/gpd present.

## 2015-11-03 NOTE — ED Notes (Signed)
Snack and beverage given. 

## 2015-11-03 NOTE — BH Assessment (Signed)
Faxed updated information to [facility].

## 2015-11-03 NOTE — ED Notes (Signed)
On the phone 

## 2015-11-03 NOTE — ED Notes (Signed)
Pt. Out of rest room. In his room singing loudly. Pt. Informed that there were 9 other patients sleeping and that he needed to lower the volume.

## 2015-11-03 NOTE — BH Assessment (Signed)
Pt has been accepted to [facility] per Wende Crease, RN. Nurse report # [phone]. Informed Dr. Effie Shy and Marita Snellen, RN of disposition.

## 2015-11-03 NOTE — ED Notes (Signed)
Pt. Noted in room talking to NP Alberteen Sam. No complaints or concerns voiced. No distress or abnormal behavior noted. Will continue to monitor with security cameras. Q 15 minute rounds continue.

## 2015-11-03 NOTE — Progress Notes (Signed)
Reassessment   Patient is erratic, inappropriate towards staff and peers, pacing, and walking in other patient rooms.  Patient was witnessed inappropriately in other patient faces making aggressive gestures needing staff redirection.  Patient was in the hallway threatening the psychiatrist.  Patient threatened to physically attack the psychiatrist once he walked in the hallway.  Patient was screaming and yelling.     Maryelizabeth Rowan, MSW, [facility] Triage Specialist [phone]

## 2015-11-03 NOTE — ED Notes (Signed)
Pt. Noted in room exorcising and applying lotion.  No complaints or concerns voiced. No acute distress noted. Will continue to monitor with security cameras. Q 15 minute rounds continue.

## 2015-11-04 MED ORDER — IBUPROFEN 200 MG PO TABS
600.0000 mg | ORAL_TABLET | Freq: Once | ORAL | Status: AC
  Administered 2015-11-04: 600 mg via ORAL
  Filled 2015-11-04: qty 3

## 2015-11-04 MED ORDER — DIPHENHYDRAMINE HCL 50 MG/ML IJ SOLN
50.0000 mg | Freq: Once | INTRAMUSCULAR | Status: AC
  Administered 2015-11-04: 50 mg via INTRAVENOUS
  Filled 2015-11-04: qty 1

## 2015-11-04 MED ORDER — LORAZEPAM 2 MG/ML IJ SOLN
2.0000 mg | Freq: Once | INTRAMUSCULAR | Status: AC
  Administered 2015-11-04: 2 mg via INTRAVENOUS
  Filled 2015-11-04: qty 1

## 2015-11-04 MED ORDER — CHLORPROMAZINE HCL 25 MG/ML IJ SOLN
50.0000 mg | Freq: Once | INTRAMUSCULAR | Status: AC
Start: 2015-11-04 — End: 2015-11-04
  Administered 2015-11-04: 50 mg via INTRAMUSCULAR
  Filled 2015-11-04: qty 2

## 2015-11-04 NOTE — ED Notes (Signed)
Pt up to the bathroom, pt calm, polite

## 2015-11-04 NOTE — ED Notes (Signed)
Pt. Pacing up and down the halls talking loudly and cursing.

## 2015-11-04 NOTE — ED Notes (Signed)
Report called to [facility], sheriff contacted by previous RN for transport

## 2015-11-04 NOTE — ED Notes (Signed)
Pt. Noted sleeping in room. No complaints or concerns voiced. No distress or abnormal behavior noted. Will continue to monitor with security cameras. Q 15 minute rounds continue. 

## 2015-11-04 NOTE — ED Notes (Signed)
Pt. Slamming doors and cursing loudly despite attempts to redirect.

## 2015-11-04 NOTE — ED Notes (Addendum)
Pt ambulatory w/o difficulty w/ sheriff to [facility], sandwich given prior to leaving, pt cooperative, reports some relief from pain w/ ibuprofen.  Assessment notes, IVC papers, EMTELA, EKG, MAR report, transfer report, face sheet, and belongings given to sheriff.  Pt contacted significant other prior to leaving about his going and will contact family when he arrives.  Junious Dresser RN at [facility] updated and is aware that the patient is in transit.

## 2015-11-04 NOTE — ED Notes (Signed)
Pt. Noted in hall talking loudly and cursing related to denial of his request for a snack. No acute distress noted. Will continue to monitor with security cameras. Q 15 minute rounds continue.

## 2015-11-04 NOTE — ED Notes (Signed)
Pt.  to shower room for a quick shower at his request to avoid continued yelling and cursing till medications start working.

## 2015-11-04 NOTE — ED Notes (Signed)
Up to the bathroom 

## 2015-11-04 NOTE — ED Provider Notes (Signed)
Patient accepted to [facility]. Patient accepted by RN, Patricia Pesa. [facility] will not provide a physician name who is accepting, stating they won't know until patient arrives  Pricilla Loveless, MD 11/04/15 0800

## 2015-11-04 NOTE — ED Notes (Signed)
Sheriff will be here in approx 45 mins

## 2015-11-24 ENCOUNTER — Encounter (HOSPITAL_COMMUNITY): Payer: Self-pay | Admitting: Emergency Medicine

## 2015-11-24 ENCOUNTER — Emergency Department (HOSPITAL_COMMUNITY)
Admission: EM | Admit: 2015-11-24 | Discharge: 2015-11-24 | Disposition: A | Discharge status: Home / Self Care | Payer: Medicaid Other | Source: Home / Self Care | Attending: Emergency Medicine | Admitting: Emergency Medicine

## 2015-11-24 DIAGNOSIS — G2401 Drug induced subacute dyskinesia: Secondary | ICD-10-CM | POA: Insufficient documentation

## 2015-11-24 DIAGNOSIS — F1210 Cannabis abuse, uncomplicated: Secondary | ICD-10-CM

## 2015-11-24 DIAGNOSIS — T433X5A Adverse effect of phenothiazine antipsychotics and neuroleptics, initial encounter: Secondary | ICD-10-CM

## 2015-11-24 DIAGNOSIS — Z87891 Personal history of nicotine dependence: Secondary | ICD-10-CM

## 2015-11-24 DIAGNOSIS — F319 Bipolar disorder, unspecified: Secondary | ICD-10-CM

## 2015-11-24 DIAGNOSIS — Z79899 Other long term (current) drug therapy: Secondary | ICD-10-CM

## 2015-11-24 DIAGNOSIS — Z8781 Personal history of (healed) traumatic fracture: Secondary | ICD-10-CM

## 2015-11-24 LAB — RAPID URINE DRUG SCREEN, HOSP PERFORMED
AMPHETAMINES: NOT DETECTED
BARBITURATES: NOT DETECTED
Benzodiazepines: NOT DETECTED
Cocaine: NOT DETECTED
Opiates: NOT DETECTED
Tetrahydrocannabinol: POSITIVE — AB

## 2015-11-24 MED ORDER — SODIUM CHLORIDE 0.9 % IV BOLUS (SEPSIS): 1000.0000 mL | Freq: Once | INTRAVENOUS | Status: DC

## 2015-11-24 MED ORDER — LORAZEPAM 1 MG PO TABS
1.0000 mg | ORAL_TABLET | Freq: Once | ORAL | Status: AC
  Administered 2015-11-24: 1 mg via ORAL
  Filled 2015-11-24: qty 1

## 2015-11-24 MED ORDER — LORAZEPAM 2 MG/ML IJ SOLN
1.0000 mg | Freq: Once | INTRAMUSCULAR | Status: DC
  Filled 2015-11-24: qty 1

## 2015-11-24 MED ORDER — LORAZEPAM 1 MG PO TABS: 1.0000 mg | ORAL_TABLET | Freq: Two times a day (BID) | ORAL | Status: DC | PRN

## 2015-11-24 NOTE — ED Notes (Signed)
Pt complaint of unsteady gait and drooling with use of trazodone and fluphenazine taken as prescribed. Pt states "the medicine is too strong." Pt denies SI/HI and A/VH.

## 2015-11-24 NOTE — ED Notes (Signed)
Awake. Verbally responsive. A/O x4. Resp even and unlabored. No audible adventitious breath sounds noted. ABC's intact.  

## 2015-11-24 NOTE — ED Provider Notes (Signed)
CSN: [id number]     Arrival date & time 11/24/15  1012 History   First MD Initiated Contact with Patient 11/24/15 1028     Chief Complaint  Patient presents with  . Medication Reaction     (Consider location/radiation/quality/duration/timing/severity/associated sxs/prior Treatment) The history is provided by the patient.   Rodney Chandler is a 30 y.o. male hx of bipolar, schizophrenia here with possible dystonic reaction. He was recently in the ED and admitted to [facility] and was started on prolixin and trazodone and was discharged 3 days ago. States that he has been taking his meds as prescribed. He did take prolixin this morning. Since yesterday, has uncontrolled lip smacking and has been drooling uncontrollably. Denies alcohol use. He does smoke marijuana. Denies suicidal or homicidal ideations.    Past Medical History  Diagnosis Date  . Bipolar 1 disorder (HCC)   . Manic depression (HCC)   . Schizophrenia (HCC)   . Mandibular fracture (HCC) 09/17/2013   Past Surgical History  Procedure Laterality Date  . Orif mandibular fracture N/A 09/17/2013    Procedure: OPEN REDUCTION INTERNAL FIXATION (ORIF) MANDIBULAR FRACTURE;  Surgeon: Darletta Moll, MD;  Location: [facility] OR;  Service: ENT;  Laterality: N/A;  . Mouth surgery    . Mandibular hardware removal N/A 10/18/2013    Procedure: MANDIBULAR HARDWARE REMOVAL;  Surgeon: Darletta Moll, MD;  Location: [facility];  Service: ENT;  Laterality: N/A;   No family history on file. Social History  Substance Use Topics  . Smoking status: Former Smoker -- 0.50 packs/day    Types: Cigarettes  . Smokeless tobacco: Never Used  . Alcohol Use: Yes     Comment: Weekends.     Review of Systems  Neurological: Positive for tremors.  All other systems reviewed and are negative.     Allergies  Prolixin; Lithium; Depakote; and Zyprexa  Home Medications   Prior to Admission medications   Medication Sig Start Date End Date Taking?  Authorizing Provider  diphenhydramine-acetaminophen (TYLENOL PM) 25-500 MG TABS tablet Take 2 tablets by mouth daily as needed (for sleep).   Yes Historical Provider, MD  fluPHENAZine (PROLIXIN) 10 MG tablet Take 10 mg by mouth 2 (two) times daily. Take with  tablet to make    Yes Historical Provider, MD  fluPHENAZine (PROLIXIN) 5 MG tablet Take 5 mg by mouth 2 (two) times daily. Takes with  tablet to make    Yes Historical Provider, MD  traZODone (DESYREL) 50 MG tablet Take 50 mg by mouth at bedtime.   Yes Historical Provider, MD  amantadine (SYMMETREL) 100 MG capsule Take 1 capsule (100 mg total) by mouth 2 (two) times daily. Patient not taking: Reported on 10/29/2015 09/20/15   Earney Navy, NP  haloperidol (HALDOL) 5 MG tablet Take 1 tablet (5 mg total) by mouth 2 (two) times daily. Patient not taking: Reported on 11/24/2015 09/20/15   Earney Navy, NP  Oxcarbazepine (TRILEPTAL) 300 MG tablet Take 1 tablet (300 mg total) by mouth 2 (two) times daily. Patient not taking: Reported on 10/29/2015 09/20/15   Earney Navy, NP  traZODone (DESYREL) 100 MG tablet Take 1 tablet (100 mg total) by mouth at bedtime. Patient not taking: Reported on 11/24/2015 09/20/15   Earney Navy, NP   BP 145/81 mmHg  Pulse 80  Temp(Src) 98.1 F (36.7 C) (Oral)  Resp 18  Ht  (1.803 m)  Wt 150 lb (68.04 kg)  BMI 20.93 kg/m2  SpO2 100% Physical Exam  Constitutional: He is oriented to person, place, and time. He appears well-nourished.  Obvious lip smacking and tremors   HENT:  Head: Normocephalic.  Mouth/Throat: Oropharynx is clear and moist.  Eyes: Conjunctivae are normal. Pupils are equal, round, and reactive to light.  Neck: Normal range of motion. Neck supple.  Cardiovascular: Normal rate, regular rhythm and normal heart sounds.   Pulmonary/Chest: Effort normal and breath sounds normal. No respiratory distress. He has no wheezes. He has no rales.  Abdominal: Soft.  Bowel sounds are normal. He exhibits no distension. There is no tenderness. There is no rebound.  Musculoskeletal: Normal range of motion. He exhibits no edema or tenderness.  Neurological: He is alert and oriented to person, place, and time.  Obvious lip smacking and some drooling. No obvious facial droop. Nl strength throughout. Has intentional tremors that improved with movement.   Skin: Skin is warm and dry.  Psychiatric:  Poor judgment   Nursing note and vitals reviewed.   ED Course  Procedures (including critical care time) Labs Review Labs Reviewed  URINE RAPID DRUG SCREEN, HOSP PERFORMED - Abnormal; Notable for the following:    Tetrahydrocannabinol POSITIVE (*)    All other components within normal limits  CBC WITH DIFFERENTIAL/PLATELET  COMPREHENSIVE METABOLIC PANEL  ETHANOL  SALICYLATE LEVEL  ACETAMINOPHEN LEVEL  CK    Imaging Review No results found. I have personally reviewed and evaluated these images and lab results as part of my medical decision-making.   EKG Interpretation   Date/Time:  Saturday November 24 2015 11:09:22 EST Ventricular Rate:  70 PR Interval:  129 QRS Duration: 85 QT Interval:  371 QTC Calculation: 400 R Axis:   86 Text Interpretation:  Sinus rhythm RSR' in V1 or V2, probably normal  variant Baseline wander in lead(s) V1 No significant change since last  tracing Confirmed by Tecla Mailloux  MD, Aljean Horiuchi ([id number]) on 11/24/2015 11:16:41 AM      MDM   Final diagnoses:  None    Rodney Chandler is a 30 y.o. 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 Will get tox to make sure that he is not taking something else as well. Will give benzos.   12:11 PM Refused blood work. UDS + marijuana, which he admits to taking. Symptoms improved with ativan. Patient adamantly denies alcohol use or overdose and wife agrees at bedside. Will start on ativan prn. Will have him dc fluphenazine for now. He has psych follow up on Monday.     Richardean Canal, MD 11/24/15 1213

## 2015-11-24 NOTE — Discharge Instructions (Signed)
Stop taking prolinix (fluphentazine).  Take ativan as needed for tremors or facial twitching.   See your psychiatrist on Monday for prescription for another medication for depression   Return to ER if you have thoughts of harming yourself or others, hallucinations, agitation, worse tremors.

## 2015-11-24 NOTE — ED Notes (Signed)
Pt continues to refuse blood draws and IV access. Dr. Silverio Lay aware and given verbal order to d/c labs, NS and Ativan IV

## 2015-11-24 NOTE — ED Notes (Addendum)
Pt reported that he wanted to decrease his Prolixin medication. Pt refusing to allow nurse to obtain blood specimen and get undress. Dr. Silverio Lay aware

## 2015-11-25 ENCOUNTER — Inpatient Hospital Stay (HOSPITAL_COMMUNITY): Payer: Medicaid Other

## 2015-11-25 ENCOUNTER — Encounter (HOSPITAL_COMMUNITY): Payer: Self-pay | Admitting: Emergency Medicine

## 2015-11-25 ENCOUNTER — Emergency Department (HOSPITAL_COMMUNITY): Payer: Medicaid Other

## 2015-11-25 ENCOUNTER — Inpatient Hospital Stay (HOSPITAL_COMMUNITY)
Admission: EM | Admit: 2015-11-25 | Discharge: 2015-11-27 | DRG: 093 | Discharge status: Against Medical Advice | Payer: Medicaid Other | Attending: Internal Medicine | Admitting: Internal Medicine

## 2015-11-25 DIAGNOSIS — F319 Bipolar disorder, unspecified: Secondary | ICD-10-CM | POA: Diagnosis present

## 2015-11-25 DIAGNOSIS — F209 Schizophrenia, unspecified: Secondary | ICD-10-CM | POA: Diagnosis present

## 2015-11-25 DIAGNOSIS — Z79899 Other long term (current) drug therapy: Secondary | ICD-10-CM

## 2015-11-25 DIAGNOSIS — R569 Unspecified convulsions: Secondary | ICD-10-CM | POA: Diagnosis present

## 2015-11-25 DIAGNOSIS — R748 Abnormal levels of other serum enzymes: Secondary | ICD-10-CM | POA: Diagnosis not present

## 2015-11-25 DIAGNOSIS — G210 Malignant neuroleptic syndrome: Principal | ICD-10-CM | POA: Diagnosis present

## 2015-11-25 DIAGNOSIS — F250 Schizoaffective disorder, bipolar type: Secondary | ICD-10-CM | POA: Diagnosis not present

## 2015-11-25 DIAGNOSIS — F1220 Cannabis dependence, uncomplicated: Secondary | ICD-10-CM | POA: Diagnosis not present

## 2015-11-25 DIAGNOSIS — F309 Manic episode, unspecified: Secondary | ICD-10-CM

## 2015-11-25 DIAGNOSIS — Z888 Allergy status to other drugs, medicaments and biological substances status: Secondary | ICD-10-CM

## 2015-11-25 DIAGNOSIS — F312 Bipolar disorder, current episode manic severe with psychotic features: Secondary | ICD-10-CM | POA: Diagnosis present

## 2015-11-25 DIAGNOSIS — Z87891 Personal history of nicotine dependence: Secondary | ICD-10-CM

## 2015-11-25 DIAGNOSIS — T433X5A Adverse effect of phenothiazine antipsychotics and neuroleptics, initial encounter: Secondary | ICD-10-CM | POA: Diagnosis present

## 2015-11-25 DIAGNOSIS — R400 Somnolence: Secondary | ICD-10-CM | POA: Diagnosis not present

## 2015-11-25 DIAGNOSIS — R4182 Altered mental status, unspecified: Secondary | ICD-10-CM | POA: Diagnosis present

## 2015-11-25 LAB — COMPREHENSIVE METABOLIC PANEL
ALBUMIN: 4.4 g/dL (ref 3.5–5.0)
ALT: 74 U/L — AB (ref 17–63)
ALT: 69 U/L — AB (ref 17–63)
ANION GAP: 15 (ref 5–15)
AST: 47 U/L — AB (ref 15–41)
AST: 43 U/L — ABNORMAL HIGH (ref 15–41)
Albumin: 3.7 g/dL (ref 3.5–5.0)
Alkaline Phosphatase: 67 U/L (ref 38–126)
Alkaline Phosphatase: 61 U/L (ref 38–126)
Anion gap: 9 (ref 5–15)
BUN: 6 mg/dL (ref 6–20)
BUN: <5 mg/dL — ABNORMAL LOW (ref 6–20)
CHLORIDE: 102 mmol/L (ref 101–111)
CHLORIDE: 102 mmol/L (ref 101–111)
CO2: 28 mmol/L (ref 22–32)
CO2: 24 mmol/L (ref 22–32)
CREATININE: 0.83 mg/dL (ref 0.61–1.24)
CREATININE: 0.87 mg/dL (ref 0.61–1.24)
Calcium: 9.9 mg/dL (ref 8.9–10.3)
Calcium: 9.6 mg/dL (ref 8.9–10.3)
GFR calc Af Amer: >60 mL/min (ref >60)
GLUCOSE: 80 mg/dL (ref 65–99)
Glucose, Bld: 100 mg/dL — ABNORMAL HIGH (ref 65–99)
POTASSIUM: 3.8 mmol/L (ref 3.5–5.1)
Potassium: 4 mmol/L (ref 3.5–5.1)
SODIUM: 141 mmol/L (ref 135–145)
Sodium: 139 mmol/L (ref 135–145)
Total Bilirubin: 0.9 mg/dL (ref 0.3–1.2)
Total Bilirubin: 0.7 mg/dL (ref 0.3–1.2)
Total Protein: 7.6 g/dL (ref 6.5–8.1)
Total Protein: 6.8 g/dL (ref 6.5–8.1)

## 2015-11-25 LAB — CSF CELL COUNT WITH DIFFERENTIAL
RBC COUNT CSF: 1725 /mm3 — AB
RBC COUNT CSF: 39 /mm3 — AB
TUBE #: 1
TUBE #: 4
WBC CSF: 1 /mm3 (ref 0–5)
WBC, CSF: 0 /mm3 (ref 0–5)

## 2015-11-25 LAB — GLUCOSE, CAPILLARY
GLUCOSE-CAPILLARY: 90 mg/dL (ref 65–99)
GLUCOSE-CAPILLARY: 121 mg/dL — AB (ref 65–99)

## 2015-11-25 LAB — CBC WITH DIFFERENTIAL/PLATELET
Basophils Absolute: 0 10*3/uL (ref 0.0–0.1)
Basophils Absolute: 0 10*3/uL (ref 0.0–0.1)
Basophils Relative: 0 %
Basophils Relative: 0 %
EOS ABS: 0.1 10*3/uL (ref 0.0–0.7)
EOS PCT: 1 %
Eosinophils Absolute: 0.1 10*3/uL (ref 0.0–0.7)
Eosinophils Relative: 1 %
HEMATOCRIT: 40 % (ref 39.0–52.0)
HEMATOCRIT: 40.2 % (ref 39.0–52.0)
HEMOGLOBIN: 13.2 g/dL (ref 13.0–17.0)
Hemoglobin: 13.2 g/dL (ref 13.0–17.0)
LYMPHS ABS: 1.4 10*3/uL (ref 0.7–4.0)
LYMPHS ABS: 1.5 10*3/uL (ref 0.7–4.0)
LYMPHS PCT: 12 %
Lymphocytes Relative: 18 %
MCH: 32.3 pg (ref 26.0–34.0)
MCH: 32 pg (ref 26.0–34.0)
MCHC: 33 g/dL (ref 30.0–36.0)
MCHC: 32.8 g/dL (ref 30.0–36.0)
MCV: 97.8 fL (ref 78.0–100.0)
MCV: 97.6 fL (ref 78.0–100.0)
MONO ABS: 1.2 10*3/uL — AB (ref 0.1–1.0)
MONOS PCT: 11 %
MONOS PCT: 17 %
Monocytes Absolute: 1.5 10*3/uL — ABNORMAL HIGH (ref 0.1–1.0)
NEUTROS ABS: 8.8 10*3/uL — AB (ref 1.7–7.7)
NEUTROS ABS: 5.7 10*3/uL (ref 1.7–7.7)
NEUTROS PCT: 64 %
Neutrophils Relative %: 76 %
PLATELETS: 269 10*3/uL (ref 150–400)
Platelets: 239 10*3/uL (ref 150–400)
RBC: 4.09 MIL/uL — ABNORMAL LOW (ref 4.22–5.81)
RBC: 4.12 MIL/uL — AB (ref 4.22–5.81)
RDW: 13.3 % (ref 11.5–15.5)
RDW: 13.3 % (ref 11.5–15.5)
WBC: 11.5 10*3/uL — ABNORMAL HIGH (ref 4.0–10.5)
WBC: 8.8 10*3/uL (ref 4.0–10.5)

## 2015-11-25 LAB — PROTIME-INR
INR: 1.06 (ref 0.00–1.49)
PROTHROMBIN TIME: 14 s (ref 11.6–15.2)

## 2015-11-25 LAB — RAPID URINE DRUG SCREEN, HOSP PERFORMED
Amphetamines: NOT DETECTED
BENZODIAZEPINES: NOT DETECTED
Barbiturates: NOT DETECTED
COCAINE: NOT DETECTED
Opiates: NOT DETECTED
Tetrahydrocannabinol: POSITIVE — AB

## 2015-11-25 LAB — CK: Total CK: 255 U/L (ref 49–397)

## 2015-11-25 LAB — I-STAT CG4 LACTIC ACID, ED
LACTIC ACID, VENOUS: 1.53 mmol/L (ref 0.5–2.0)
LACTIC ACID, VENOUS: 2.59 mmol/L — AB (ref 0.5–2.0)
LACTIC ACID, VENOUS: 1.04 mmol/L (ref 0.5–2.0)

## 2015-11-25 LAB — URINALYSIS, ROUTINE W REFLEX MICROSCOPIC
Bilirubin Urine: NEGATIVE
GLUCOSE, UA: NEGATIVE mg/dL
HGB URINE DIPSTICK: NEGATIVE
Ketones, ur: NEGATIVE mg/dL
LEUKOCYTES UA: NEGATIVE
Nitrite: NEGATIVE
Protein, ur: NEGATIVE mg/dL
SPECIFIC GRAVITY, URINE: 1.007 (ref 1.005–1.030)
pH: 6.5 (ref 5.0–8.0)

## 2015-11-25 LAB — ETHANOL

## 2015-11-25 LAB — APTT: APTT: 34 s (ref 24–37)

## 2015-11-25 LAB — PROCALCITONIN: Procalcitonin: <0.1 ng/mL

## 2015-11-25 LAB — LACTIC ACID, PLASMA: LACTIC ACID, VENOUS: 1.2 mmol/L (ref 0.5–2.0)

## 2015-11-25 LAB — IRON: IRON: 46 ug/dL (ref 45–182)

## 2015-11-25 LAB — PROTEIN AND GLUCOSE, CSF
GLUCOSE CSF: 72 mg/dL — AB (ref 40–70)
TOTAL PROTEIN, CSF: 36 mg/dL (ref 15–45)

## 2015-11-25 MED ORDER — HEPARIN SODIUM (PORCINE) 5000 UNIT/ML IJ SOLN
5000.0000 [IU] | Freq: Three times a day (TID) | INTRAMUSCULAR | Status: DC
  Administered 2015-11-25 – 2015-11-27 (×4): 5000 [IU] via SUBCUTANEOUS
  Filled 2015-11-25 (×5): qty 1

## 2015-11-25 MED ORDER — ZOLPIDEM TARTRATE 5 MG PO TABS: 5.0000 mg | ORAL_TABLET | Freq: Every evening | ORAL | Status: DC | PRN

## 2015-11-25 MED ORDER — LORAZEPAM 2 MG/ML IJ SOLN
2.0000 mg | Freq: Once | INTRAMUSCULAR | Status: DC
  Administered 2015-11-25: 2 mg via INTRAMUSCULAR
  Filled 2015-11-25: qty 1

## 2015-11-25 MED ORDER — MIDAZOLAM HCL 2 MG/2ML IJ SOLN
INTRAMUSCULAR | Status: AC
  Administered 2015-11-25: 1 mg
  Filled 2015-11-25: qty 2

## 2015-11-25 MED ORDER — ACETAMINOPHEN 325 MG PO TABS
650.0000 mg | ORAL_TABLET | ORAL | Status: DC | PRN
  Administered 2015-11-27: 650 mg via ORAL
  Filled 2015-11-25: qty 2

## 2015-11-25 MED ORDER — LORAZEPAM 2 MG/ML IJ SOLN
1.0000 mg | Freq: Four times a day (QID) | INTRAMUSCULAR | Status: DC | PRN
  Administered 2015-11-25 – 2015-11-26 (×4): 1 mg via INTRAVENOUS
  Filled 2015-11-25 (×4): qty 1

## 2015-11-25 MED ORDER — FAMOTIDINE IN NACL 20-0.9 MG/50ML-% IV SOLN
20.0000 mg | Freq: Two times a day (BID) | INTRAVENOUS | Status: DC
  Administered 2015-11-25 – 2015-11-26 (×3): 20 mg via INTRAVENOUS
  Filled 2015-11-25 (×3): qty 50

## 2015-11-25 MED ORDER — LORAZEPAM 2 MG/ML IJ SOLN: 1.0000 mg | INTRAMUSCULAR | Status: DC

## 2015-11-25 MED ORDER — SODIUM CHLORIDE 0.9 % IV SOLN
INTRAVENOUS | Status: DC
  Administered 2015-11-25 (×2): via INTRAVENOUS

## 2015-11-25 MED ORDER — DIPHENHYDRAMINE HCL 50 MG/ML IJ SOLN
50.0000 mg | Freq: Once | INTRAMUSCULAR | Status: AC
  Administered 2015-11-25: 50 mg via INTRAMUSCULAR
  Filled 2015-11-25: qty 1

## 2015-11-25 MED ORDER — DEXTROSE 5 % IV SOLN: 1.0000 g | INTRAVENOUS | Status: DC

## 2015-11-25 MED ORDER — ACETAMINOPHEN 325 MG PO TABS
650.0000 mg | ORAL_TABLET | ORAL | Status: DC | PRN
  Administered 2015-11-25: 650 mg via ORAL
  Filled 2015-11-25: qty 2

## 2015-11-25 MED ORDER — DEXTROSE 5 % IV SOLN
10.0000 mg/kg | Freq: Three times a day (TID) | INTRAVENOUS | Status: DC
  Administered 2015-11-25: 680 mg via INTRAVENOUS
  Filled 2015-11-25 (×4): qty 13.6

## 2015-11-25 MED ORDER — ALUM & MAG HYDROXIDE-SIMETH 200-200-20 MG/5ML PO SUSP: 30.0000 mL | ORAL | Status: DC | PRN

## 2015-11-25 MED ORDER — SODIUM CHLORIDE 0.9 % IV SOLN: INTRAVENOUS | Status: DC

## 2015-11-25 MED ORDER — DIPHENHYDRAMINE HCL 50 MG/ML IJ SOLN
50.0000 mg | Freq: Four times a day (QID) | INTRAMUSCULAR | Status: DC
Start: 2015-11-25 — End: 2015-11-27
  Administered 2015-11-25 – 2015-11-27 (×7): 50 mg via INTRAMUSCULAR
  Filled 2015-11-25 (×7): qty 1

## 2015-11-25 MED ORDER — VANCOMYCIN HCL IN DEXTROSE 1-5 GM/200ML-% IV SOLN
1000.0000 mg | Freq: Three times a day (TID) | INTRAVENOUS | Status: DC
  Filled 2015-11-25 (×2): qty 200

## 2015-11-25 MED ORDER — DEXTROSE 5 % IV SOLN
2.0000 g | Freq: Two times a day (BID) | INTRAVENOUS | Status: DC
  Administered 2015-11-26 (×2): 2 g via INTRAVENOUS
  Filled 2015-11-25 (×5): qty 2

## 2015-11-25 MED ORDER — NICOTINE 21 MG/24HR TD PT24
21.0000 mg | MEDICATED_PATCH | Freq: Every day | TRANSDERMAL | Status: DC
  Filled 2015-11-25 (×2): qty 1

## 2015-11-25 MED ORDER — VANCOMYCIN HCL 10 G IV SOLR
1250.0000 mg | Freq: Once | INTRAVENOUS | Status: AC
  Administered 2015-11-25: 1250 mg via INTRAVENOUS
  Filled 2015-11-25 (×2): qty 1250

## 2015-11-25 MED ORDER — DEXTROSE 5 % IV SOLN
10.0000 mg/kg | Freq: Three times a day (TID) | INTRAVENOUS | Status: DC
  Administered 2015-11-26 – 2015-11-27 (×4): 680 mg via INTRAVENOUS
  Filled 2015-11-25 (×8): qty 13.6

## 2015-11-25 MED ORDER — VANCOMYCIN HCL IN DEXTROSE 1-5 GM/200ML-% IV SOLN
1000.0000 mg | Freq: Three times a day (TID) | INTRAVENOUS | Status: DC
  Administered 2015-11-26 – 2015-11-27 (×4): 1000 mg via INTRAVENOUS
  Filled 2015-11-25 (×8): qty 200

## 2015-11-25 MED ORDER — LORAZEPAM 2 MG/ML IJ SOLN: 2.0000 mg | Freq: Once | INTRAMUSCULAR | Status: DC

## 2015-11-25 MED ORDER — SODIUM CHLORIDE 0.9 % IV SOLN: 250.0000 mL | INTRAVENOUS | Status: DC | PRN

## 2015-11-25 MED ORDER — CEFTRIAXONE SODIUM 2 G IJ SOLR
2.0000 g | Freq: Two times a day (BID) | INTRAMUSCULAR | Status: DC
  Administered 2015-11-25: 2 g via INTRAVENOUS
  Filled 2015-11-25 (×2): qty 2

## 2015-11-25 MED ORDER — ONDANSETRON HCL 4 MG PO TABS: 4.0000 mg | ORAL_TABLET | Freq: Three times a day (TID) | ORAL | Status: DC | PRN

## 2015-11-25 MED ORDER — INSULIN ASPART 100 UNIT/ML ~~LOC~~ SOLN: 0.0000 [IU] | SUBCUTANEOUS | Status: DC

## 2015-11-25 NOTE — BHH Counselor (Signed)
Spoke with Dr. Radford Pax who states that the patient was recently started on psychotropic medications and wanted to consult with psychiatry to see if his seizure like symptoms are related to his medication and if it can be adjusted. Informed psychiatry who states that they will see the patient to make medication adjustment.   Davina Poke, LCSW Therapeutic Triage Specialist [facility] 11/25/2015 9:20 AM

## 2015-11-25 NOTE — Consult Note (Signed)
Consult Reason for Consult: altered mental status Referring Physician: Dr Tyson Alias CCM  CC: altered mental status  HPI: Rodney Chandler is an 30 y.o. male hx of bipolar 1, schizophrenia, manic depression presenting to [facility] ED with depressed mental status and increased rigidity. Of note he was recently started on Prolixin. He presented to [facility] ED on 2/11 with abnormal facial movements which were felt to represent tardive dyskinesia. Given ativan and discharged home. Returned today with depressed mental status, drooling and generalized rigidity. He was given ativan and benadryl in the ED with some improvement noted. In ED noted to be febrile up to 101.1. Labs pertinent for CK of 255, normal lactic acid, WBC of 11.5.   Past Medical History  Diagnosis Date  . Bipolar 1 disorder (HCC)   . Manic depression (HCC)   . Schizophrenia (HCC)   . Mandibular fracture (HCC) 09/17/2013    Past Surgical History  Procedure Laterality Date  . Orif mandibular fracture N/A 09/17/2013    Procedure: OPEN REDUCTION INTERNAL FIXATION (ORIF) MANDIBULAR FRACTURE;  Surgeon: Darletta Moll, MD;  Location: [facility] OR;  Service: ENT;  Laterality: N/A;  . Mouth surgery    . Mandibular hardware removal N/A 10/18/2013    Procedure: MANDIBULAR HARDWARE REMOVAL;  Surgeon: Darletta Moll, MD;  Location: [facility];  Service: ENT;  Laterality: N/A;    No family history on file.  Social History:  reports that he has quit smoking. His smoking use included Cigarettes. He smoked 0.50 packs per day. He has never used smokeless tobacco. He reports that he drinks alcohol. He reports that he uses illicit drugs (Marijuana) about 14 times per week.  Allergies  Allergen Reactions  . Prolixin [Fluphenazine] Other (See Comments)    Slurred speech, foam at the mouth, spit   . Lithium Other (See Comments)    NOSEBLEED  . Depakote [Divalproex Sodium] Other (See Comments)    NOSEBLEED  . Zyprexa [Olanzapine] Other (See Comments)   NOSEBLEED    Medications: I have reviewed the patient's current medications.  ROS: Out of a complete 14 system review, the patient complains of only the following symptoms, and all other reviewed systems are negative. +altered mental status  Physical Examination: Filed Vitals:   11/25/15 1400 11/25/15 1415  BP: 102/72 106/72  Pulse:    Temp:    Resp: 21 24   Physical Exam  Constitutional: He appears well-developed and well-nourished.  Psych: blunted affect Eyes: No scleral injection HENT: No OP obstrucion Head: Normocephalic. + nuchal rigidity Cardiovascular: Normal rate and regular rhythm.  Respiratory: Effort normal and breath sounds normal.  GI: Soft. Bowel sounds are normal. No distension. There is no tenderness.  Skin: WDI  Neurologic Examination Mental Status: Lethargic but will open eyes to verbal stimuli, quickly closes if not constantly stimulated. Non-verbal. Intermittently will follow simple commands (squeeze fingers).  Cranial Nerves: II: optic discs not visualized, blinks to threat bilaterally, pupils equal, round, reactive to light and accommodation III,IV, VI: ptosis not present, eyes midline, no gaze deviaiton V,VII: face symmetric,  VIII: hearing normal bilaterally IX,X: gag reflex present XI: unable to test XII: jaw clenched Motor: Minimal spontaneous movement but appears to move all extremities symmetrically. Will localize with bilateral UE. Increased tone throughout Sensory: see above Deep Tendon Reflexes: 1+ and symmetric throughout Plantars: Right: downgoing   Left: downgoing Cerebellar: Not following commands Gait: unable to test  Laboratory Studies:   Basic Metabolic Panel:  Recent Labs Lab 11/25/15 0730  NA 139  K 4.0  CL 102  CO2 28  GLUCOSE 80  BUN 6  CREATININE 0.83  CALCIUM 9.9    Liver Function Tests:  Recent Labs Lab 11/25/15 0730  AST 47*  ALT 74*  ALKPHOS 67  BILITOT 0.9  PROT 7.6  ALBUMIN 4.4   No  results for input(s): LIPASE, AMYLASE in the last 168 hours. No results for input(s): AMMONIA in the last 168 hours.  CBC:  Recent Labs Lab 11/25/15 0730  WBC 11.5*  NEUTROABS 8.8*  HGB 13.2  HCT 40.0  MCV 97.8  PLT 269    Cardiac Enzymes:  Recent Labs Lab 11/25/15 1120  CKTOTAL 255    BNP: Invalid input(s): POCBNP  CBG: No results for input(s): GLUCAP in the last 168 hours.  Microbiology: No results found for this or any previous visit.  Coagulation Studies: No results for input(s): LABPROT, INR in the last 72 hours.  Urinalysis:  Recent Labs Lab 11/25/15 0730  COLORURINE YELLOW  LABSPEC 1.007  PHURINE 6.5  GLUCOSEU NEGATIVE  HGBUR NEGATIVE  BILIRUBINUR NEGATIVE  KETONESUR NEGATIVE  PROTEINUR NEGATIVE  NITRITE NEGATIVE  LEUKOCYTESUR NEGATIVE    Lipid Panel:  No results found for: CHOL, TRIG, HDL, CHOLHDL, VLDL, LDLCALC  HgbA1C: No results found for: HGBA1C  Urine Drug Screen:     Component Value Date/Time   LABOPIA NONE DETECTED 11/25/2015 0730   COCAINSCRNUR NONE DETECTED 11/25/2015 0730   LABBENZ NONE DETECTED 11/25/2015 0730   AMPHETMU NONE DETECTED 11/25/2015 0730   THCU POSITIVE* 11/25/2015 0730   LABBARB NONE DETECTED 11/25/2015 0730    Alcohol Level:  Recent Labs Lab 11/25/15 0731  ETH <5    Other results:  Imaging: Dg Chest 2 View  11/25/2015  CLINICAL DATA:  30 year old male with acute chest pain following seizure. EXAM: CHEST  2 VIEW COMPARISON:  04/26/2015 chest radiograph FINDINGS: The cardiomediastinal silhouette is unremarkable. There is no evidence of focal airspace disease, pulmonary edema, suspicious pulmonary nodule/mass, pleural effusion, or pneumothorax. No acute bony abnormalities are identified. IMPRESSION: No active cardiopulmonary disease. Electronically Signed   By: Harmon Pier M.D.   On: 11/25/2015 11:25   Dg Pelvis 1-2 Views  11/25/2015  CLINICAL DATA:  Pain following seizure EXAM: PELVIS - 1-2 VIEW  COMPARISON:  None. FINDINGS: There is no evidence of pelvic fracture or dislocation. Joint spaces appear intact. Sacroiliac joints appear normal bilaterally. IMPRESSION: No fracture or dislocation.  No appreciable arthropathy. Electronically Signed   By: Bretta Bang III M.D.   On: 11/25/2015 07:56     Assessment/Plan:  29y/o gentleman with hx of bipolar disorder, schizophrenia recently started on prolixin. Presented to ED today with depressed mental status, fever and generalized rigidity. Labs pertinent for CK of 255 and WBC of 11.5 with temperature of 101.1. With recent addition of prolixin cannot rule out NMS or dystonic reaction though overall picture not fully typical for this. Fever, AMS and leukocytosis raises possibility of CNS infection. Patient is following commands and localizing making seizure/NCSE less likely.   Discussed case with CCM. Agree with plan for empiric coverage and LP to rule out CNS infection. Plan for supportive care and scheduled benadryl with prn benzo. If further deterioration will plan for more aggressive medical therapy, such as dantrolene, for possible NMS. Further workup pending CSF results.     Elspeth Cho, DO [facility] [phone]  If 7pm- 7am, please page neurology on call as listed in [facility]ION. 11/25/2015, 3:04 PM

## 2015-11-25 NOTE — ED Notes (Signed)
Per EMS pt was walking outside at the [employer] station/laundry mat and had a seizure witnessed by a bystander States lasted about a minute  Pt was altered when EMS arrived  No trauma noted  Pt has hx of seizures and is homeless  Pt is noncompliant with his meds  Pt is alert and oriented upon arrival to hospital and is able to follow commands

## 2015-11-25 NOTE — ED Notes (Signed)
Pt comes in via EMS, with seizures.ems reports pt had a 1 minute witness seizure at the gas station. Ems reports he has not been taking his medications as directed. Pt does verbalize but is difficult to arouse.

## 2015-11-25 NOTE — ED Notes (Signed)
Daily medications according to mom: -trazodone  1 tab at bedtime -fluphenazine  bid

## 2015-11-25 NOTE — ED Notes (Signed)
[facility] called to transfer table

## 2015-11-25 NOTE — ED Notes (Signed)
Pt is seen at [facility] each month, pt is noncompliant on visits

## 2015-11-25 NOTE — ED Provider Notes (Signed)
CSN: [id number]     Arrival date & time 11/25/15  0702 History   First MD Initiated Contact with Patient 11/25/15 [phone]     Chief Complaint  Patient presents with  . Seizures     (Consider location/radiation/quality/duration/timing/severity/associated sxs/prior Treatment) Patient is a 30 y.o. male presenting with general illness. The history is provided by the patient.  Illness Severity:  Moderate Onset quality:  Sudden Duration:  2 days Timing:  Constant Progression:  Worsening Chronicity:  New   29 yo M with a chief complaint of stiffness. Patient is receiving started on a new antipsychotic and was seen over at [facility] for this. There is concern for NMS and the patient was sent over here for evaluation by neurology and critical care.  Past Medical History  Diagnosis Date  . Bipolar 1 disorder (HCC)   . Manic depression (HCC)   . Schizophrenia (HCC)   . Mandibular fracture (HCC) 09/17/2013   Past Surgical History  Procedure Laterality Date  . Orif mandibular fracture N/A 09/17/2013    Procedure: OPEN REDUCTION INTERNAL FIXATION (ORIF) MANDIBULAR FRACTURE;  Surgeon: Darletta Moll, MD;  Location: [facility] OR;  Service: ENT;  Laterality: N/A;  . Mouth surgery    . Mandibular hardware removal N/A 10/18/2013    Procedure: MANDIBULAR HARDWARE REMOVAL;  Surgeon: Darletta Moll, MD;  Location: [facility];  Service: ENT;  Laterality: N/A;   No family history on file. Social History  Substance Use Topics  . Smoking status: Former Smoker -- 0.50 packs/day    Types: Cigarettes  . Smokeless tobacco: Never Used  . Alcohol Use: Yes     Comment: Weekends.     Review of Systems    Allergies  Prolixin; Lithium; Depakote; and Zyprexa  Home Medications   Prior to Admission medications   Medication Sig Start Date End Date Taking? Authorizing Provider  fluPHENAZine (PROLIXIN) 10 MG tablet Take 10 mg by mouth 2 (two) times daily. Take with  tablet to make    Yes Historical  Provider, MD  fluPHENAZine (PROLIXIN) 5 MG tablet Take 5 mg by mouth 2 (two) times daily. Takes with  tablet to make    Yes Historical Provider, MD  traZODone (DESYREL) 50 MG tablet Take 50 mg by mouth at bedtime.   Yes Historical Provider, MD  amantadine (SYMMETREL) 100 MG capsule Take 1 capsule (100 mg total) by mouth 2 (two) times daily. Patient not taking: Reported on 10/29/2015 09/20/15   Earney Navy, NP  diphenhydramine-acetaminophen (TYLENOL PM) 25-500 MG TABS tablet Take 2 tablets by mouth daily as needed (for sleep). Reported on 11/25/2015    Historical Provider, MD  haloperidol (HALDOL) 5 MG tablet Take 1 tablet (5 mg total) by mouth 2 (two) times daily. Patient not taking: Reported on 11/24/2015 09/20/15   Earney Navy, NP  LORazepam (ATIVAN) 1 MG tablet Take 1 tablet (1 mg total) by mouth 2 (two) times daily as needed for anxiety. 11/24/15   Richardean Canal, MD  Oxcarbazepine (TRILEPTAL) 300 MG tablet Take 1 tablet (300 mg total) by mouth 2 (two) times daily. Patient not taking: Reported on 10/29/2015 09/20/15   Earney Navy, NP  traZODone (DESYREL) 100 MG tablet Take 1 tablet (100 mg total) by mouth at bedtime. Patient not taking: Reported on 11/24/2015 09/20/15   Earney Navy, NP   BP 108/72 mmHg  Pulse 71  Temp(Src) 99.1 F (37.3 C) (Oral)  Resp 20  SpO2 98% Physical  Exam  Constitutional: He is oriented to person, place, and time. He appears well-developed and well-nourished.  HENT:  Head: Normocephalic and atraumatic.  Eyes: EOM are normal. Pupils are equal, round, and reactive to light.  Neck: Normal range of motion. Neck supple. No JVD present.  Cardiovascular: Normal rate and regular rhythm.  Exam reveals no gallop and no friction rub.   No murmur heard. Pulmonary/Chest: No respiratory distress. He has no wheezes.  Abdominal: He exhibits no distension. There is no tenderness. There is no rebound and no guarding.  Musculoskeletal: Normal range of  motion.  Neurological: He is alert and oriented to person, place, and time.  Reflex Scores:      Tricep reflexes are 3+ on the right side and 3+ on the left side.      Bicep reflexes are 3+ on the right side and 3+ on the left side.      Brachioradialis reflexes are 3+ on the right side and 3+ on the left side.      Patellar reflexes are 3+ on the right side and 3+ on the left side.      Achilles reflexes are 3+ on the right side and 3+ on the left side. Diffuse stiffness.  Mild stiffness of neck, no noted pain.   Skin: No rash noted. No pallor.  Psychiatric: He has a normal mood and affect. His behavior is normal.  Nursing note and vitals reviewed.   ED Course  Procedures (including critical care time) Labs Review Labs Reviewed  URINE RAPID DRUG SCREEN, HOSP PERFORMED - Abnormal; Notable for the following:    Tetrahydrocannabinol POSITIVE (*)    All other components within normal limits  COMPREHENSIVE METABOLIC PANEL - Abnormal; Notable for the following:    AST 47 (*)    ALT 74 (*)    All other components within normal limits  CBC WITH DIFFERENTIAL/PLATELET - Abnormal; Notable for the following:    WBC 11.5 (*)    RBC 4.09 (*)    Neutro Abs 8.8 (*)    Monocytes Absolute 1.2 (*)    All other components within normal limits  I-STAT CG4 LACTIC ACID, ED - Abnormal; Notable for the following:    Lactic Acid, Venous 2.59 (*)    All other components within normal limits  CULTURE, BLOOD (ROUTINE X 2)  CULTURE, BLOOD (ROUTINE X 2)  CSF CULTURE  GRAM STAIN  CULTURE, BLOOD (ROUTINE X 2)  CULTURE, BLOOD (ROUTINE X 2)  URINALYSIS, ROUTINE W REFLEX MICROSCOPIC (NOT AT [facility])  ETHANOL  CK  IRON  CSF CELL COUNT WITH DIFFERENTIAL  CSF CELL COUNT WITH DIFFERENTIAL  PROTEIN AND GLUCOSE, CSF  COMPREHENSIVE METABOLIC PANEL  CBC WITH DIFFERENTIAL/PLATELET  HIV ANTIBODY (ROUTINE TESTING)  HERPES SIMPLEX VIRUS(HSV) DNA BY PCR  APTT  PROTIME-INR  I-STAT CG4 LACTIC ACID, ED  I-STAT CG4  LACTIC ACID, ED  I-STAT CG4 LACTIC ACID, ED    Imaging Review Dg Chest 2 View  11/25/2015  CLINICAL DATA:  30 year old male with acute chest pain following seizure. EXAM: CHEST  2 VIEW COMPARISON:  04/26/2015 chest radiograph FINDINGS: The cardiomediastinal silhouette is unremarkable. There is no evidence of focal airspace disease, pulmonary edema, suspicious pulmonary nodule/mass, pleural effusion, or pneumothorax. No acute bony abnormalities are identified. IMPRESSION: No active cardiopulmonary disease. Electronically Signed   By: Harmon Pier M.D.   On: 11/25/2015 11:25   Dg Pelvis 1-2 Views  11/25/2015  CLINICAL DATA:  Pain following seizure EXAM: PELVIS - 1-2 VIEW  COMPARISON:  None. FINDINGS: There is no evidence of pelvic fracture or dislocation. Joint spaces appear intact. Sacroiliac joints appear normal bilaterally. IMPRESSION: No fracture or dislocation.  No appreciable arthropathy. Electronically Signed   By: Bretta Bang III M.D.   On: 11/25/2015 07:56   I have personally reviewed and evaluated these images and lab results as part of my medical decision-making.   EKG Interpretation None      MDM   Final diagnoses:  NMS (neuroleptic malignant syndrome)    30 yo M with diffuse stiffness and fever. Neurology evaluated the patient feel that he likely needs an LP. This been done by the critical care physician. Admit.  The patients results and plan were reviewed and discussed.   Any x-rays performed were independently reviewed by myself.   CRITICAL CARE Performed by: Rae Roam   Total critical care time: 35 minutes  Critical care time was exclusive of separately billable procedures and treating other patients.  Critical care was necessary to treat or prevent imminent or life-threatening deterioration.  Critical care was time spent personally by me on the following activities: development of treatment plan with patient and/or surrogate as well as nursing,  discussions with consultants, evaluation of patient's response to treatment, examination of patient, obtaining history from patient or surrogate, ordering and performing treatments and interventions, ordering and review of laboratory studies, ordering and review of radiographic studies, pulse oximetry and re-evaluation of patient's condition.   Differential diagnosis were considered with the presenting HPI.  Medications  ondansetron (ZOFRAN) tablet 4 mg (not administered)  nicotine (NICODERM CQ - dosed in mg/24 hours) patch 21 mg (21 mg Transdermal Refused 11/25/15 0925)  acetaminophen (TYLENOL) tablet 650 mg (650 mg Oral Given 11/25/15 1145)  0.9 %  sodium chloride infusion ( Intravenous New Bag/Given 11/25/15 1139)  LORazepam (ATIVAN) injection 2 mg (not administered)  0.9 %  sodium chloride infusion (not administered)  heparin injection 5,000 Units (not administered)  famotidine (PEPCID) IVPB 20 mg premix (not administered)  acetaminophen (TYLENOL) tablet 650 mg (not administered)  insulin aspart (novoLOG) injection 0-9 Units (not administered)  diphenhydrAMINE (BENADRYL) injection 50 mg (not administered)  LORazepam (ATIVAN) injection 1 mg (not administered)  vancomycin (VANCOCIN) 1,250 mg in sodium chloride 0.9 % 250 mL IVPB (not administered)  vancomycin (VANCOCIN) IVPB 1000 mg/200 mL premix (not administered)  acyclovir (ZOVIRAX) 680 mg in dextrose 5 % 100 mL IVPB (not administered)  cefTRIAXone (ROCEPHIN) 2 g in dextrose 5 % 50 mL IVPB (not administered)  diphenhydrAMINE (BENADRYL) injection 50 mg (50 mg Intramuscular Given 11/25/15 1139)    Filed Vitals:   11/25/15 1246 11/25/15 1350 11/25/15 1400 11/25/15 1415  BP: 120/80 108/72 102/72 106/72  Pulse: 71     Temp: 99.1 F (37.3 C)     TempSrc: Oral     Resp: SpO2: 98%       Final diagnoses:  NMS (neuroleptic malignant syndrome)    Admission/ observation were discussed with the admitting physician, patient  and/or family and they are comfortable with the plan.      Melene Plan, DO 11/25/15 1502

## 2015-11-25 NOTE — ED Notes (Signed)
Report given to [place], rn TCU. Pt ready for transport.

## 2015-11-25 NOTE — ED Notes (Signed)
Attempted to reach mother. No answer at number patient provided.

## 2015-11-25 NOTE — ED Provider Notes (Signed)
I saw patient yesterday. Afebrile yesterday. Had some intentional tremors and drooling that improved with PO ativan. Able to ambulate well and walked home with significant other. No cogwheeling on my exam at that time and no stiffness. Neuro exam per my note. Patient refused labs at that time but was competent to make decisions. Came back today and see Dr. Corky Sing note for today's visit.   Richardean Canal, MD 11/25/15 [phone]

## 2015-11-25 NOTE — ED Notes (Signed)
Ordered fluid tray for patient.

## 2015-11-25 NOTE — Consult Note (Signed)
PULMONARY / CRITICAL CARE MEDICINE   Name: Rodney Chandler MRN: [id number] DOB: 06/26/1986    ADMISSION DATE:  11/25/2015 CONSULTATION DATE:  2/12  REFERRING MD:  edp  CHIEF COMPLAINT:  AMS  HISTORY OF PRESENT ILLNESS:   30 yo bipolar schizophrenic recently started on Prolixin and was seen in [facility] ED 2/11 for suspected tardive dyskinesia and treated with ativan and he walked out of ER. Represents 2/12 with decreased LOC, dull affect, increased drooling, mild nuchal rigidity. He was transferred to [facility] because of needed Neuro input. He will be admitted to ICU, CT of head to be performed, possible LP to rule neuro infection. He may need treatment for NMS in near future.  PAST MEDICAL HISTORY :  He  has a past medical history of Bipolar 1 disorder ([place]); Manic depression ([place]); Schizophrenia ([place]); and Mandibular fracture ([place]) (09/17/2013).  PAST SURGICAL HISTORY: He  has past surgical history that includes ORIF mandibular fracture (N/A, 09/17/2013); Mouth surgery; and Mandibular hardware removal (N/A, 10/18/2013).  Allergies  Allergen Reactions  . Prolixin [Fluphenazine] Other (See Comments)    Slurred speech, foam at the mouth, spit   . Lithium Other (See Comments)    NOSEBLEED  . Depakote [Divalproex Sodium] Other (See Comments)    NOSEBLEED  . Zyprexa [Olanzapine] Other (See Comments)    NOSEBLEED    No current facility-administered medications on file prior to encounter.   Current Outpatient Prescriptions on File Prior to Encounter  Medication Sig  . fluPHENAZine (PROLIXIN) 10 MG tablet Take 10 mg by mouth 2 (two) times daily. Take with 1m tablet to make 13m . fluPHENAZine (PROLIXIN) 5 MG tablet Take 5 mg by mouth 2 (two) times daily. Takes with 1083mablet to make 55m55m traZODone (DESYREL) 50 MG tablet Take 50 mg by mouth at bedtime.  . amMarland Kitchenntadine (SYMMETREL) 100 MG capsule Take 1 capsule (100 mg total) by mouth 2 (two) times daily. (Patient not taking: Reported on  10/29/2015)  . diphenhydramine-acetaminophen (TYLENOL PM) 25-500 MG TABS tablet Take 2 tablets by mouth daily as needed (for sleep). Reported on 11/25/2015  . haloperidol (HALDOL) 5 MG tablet Take 1 tablet (5 mg total) by mouth 2 (two) times daily. (Patient not taking: Reported on 11/24/2015)  . LORazepam (ATIVAN) 1 MG tablet Take 1 tablet (1 mg total) by mouth 2 (two) times daily as needed for anxiety.  . Oxcarbazepine (TRILEPTAL) 300 MG tablet Take 1 tablet (300 mg total) by mouth 2 (two) times daily. (Patient not taking: Reported on 10/29/2015)  . traZODone (DESYREL) 100 MG tablet Take 1 tablet (100 mg total) by mouth at bedtime. (Patient not taking: Reported on 11/24/2015)    FAMILY HISTORY:  His has no family status information on file.   SOCIAL HISTORY: He  reports that he has quit smoking. His smoking use included Cigarettes. He smoked 0.50 packs per day. He has never used smokeless tobacco. He reports that he drinks alcohol. He reports that he uses illicit drugs (Marijuana) about 14 times per week.  REVIEW OF SYSTEMS:   NA  SUBJECTIVE:  NAD  VITAL SIGNS: BP 108/72 mmHg  Pulse 71  Temp(Src) 99.1 F (37.3 C) (Oral)  Resp 20  SpO2 98%  HEMODYNAMICS:    VENTILATOR SETTINGS:    INTAKE / OUTPUT:    PHYSICAL EXAMINATION: General:  WNWDAAM. Neuro:  Dull affect, stunted response to command, Drooling, blunted response to pain. No cog wheel rigidity  HEENT: PERL 4 mm, mild neck stiffness  Cardiovascular:  HSR RRR Lungs: CTA Abdomen: Soft +bs Musculoskeletal:  intact Skin:  Warm and dry  LABS:  BMET  Recent Labs Lab 11/25/15 0730  NA 139  K 4.0  CL 102  CO2 28  BUN 6  CREATININE 0.83  GLUCOSE 80    Electrolytes  Recent Labs Lab 11/25/15 0730  CALCIUM 9.9    CBC  Recent Labs Lab 11/25/15 0730  WBC 11.5*  HGB 13.2  HCT 40.0  PLT 269    Coag's No results for input(s): APTT, INR in the last 168 hours.  Sepsis Markers  Recent Labs Lab  11/25/15 0740 11/25/15 1230  LATICACIDVEN 1.53 2.59*    ABG No results for input(s): PHART, PCO2ART, PO2ART in the last 168 hours.  Liver Enzymes  Recent Labs Lab 11/25/15 0730  AST 47*  ALT 74*  ALKPHOS 67  BILITOT 0.9  ALBUMIN 4.4    Cardiac Enzymes No results for input(s): TROPONINI, PROBNP in the last 168 hours.  Glucose No results for input(s): GLUCAP in the last 168 hours.  Imaging Dg Chest 2 View  11/25/2015  CLINICAL DATA:  30 year old male with acute chest pain following seizure. EXAM: CHEST  2 VIEW COMPARISON:  04/26/2015 chest radiograph FINDINGS: The cardiomediastinal silhouette is unremarkable. There is no evidence of focal airspace disease, pulmonary edema, suspicious pulmonary nodule/mass, pleural effusion, or pneumothorax. No acute bony abnormalities are identified. IMPRESSION: No active cardiopulmonary disease. Electronically Signed   By: Margarette Canada M.D.   On: 11/25/2015 11:25   Dg Pelvis 1-2 Views  11/25/2015  CLINICAL DATA:  Pain following seizure EXAM: PELVIS - 1-2 VIEW COMPARISON:  None. FINDINGS: There is no evidence of pelvic fracture or dislocation. Joint spaces appear intact. Sacroiliac joints appear normal bilaterally. IMPRESSION: No fracture or dislocation.  No appreciable arthropathy. Electronically Signed   By: Lowella Grip III M.D.   On: 11/25/2015 07:56     STUDIES:  2/12 CT head>>  CULTURES: 2/12 bcx 2>> 2/12 uc>> 2/12 csf>>  ANTIBIOTICS: 2/12 vanc>> 2/12 roc>>  SIGNIFICANT EVENTS: \  LINES/TUBES:   DISCUSSION: 30 yo bipoar with ? Reaction to prolixin   ASSESSMENT / PLAN: NEUROLOGIC A:   AMS, drooling, ? NMS vs infection Hx of bipolar schizophrenia Recently started on prolixin  per psych  Substance abuse THC  P:   RASS goal: 0 Neuro eval CT head ? LP if ct head ok.   PULMONARY A: No acute issue P:     CARDIOVASCULAR A:  No acute issue P:  Monitor in ICU  RENAL A:   No acute issue P:      GASTROINTESTINAL A:   Gi protection P:   PPI  HEMATOLOGIC A:   No acute issues P:    INFECTIOUS A:   ? Neurological infevtion P:   See ID LP for CSF cultures see ID  ENDOCRINE A:   No acute issue P:     FAMILY  - Updates: No family at bedside  - Inter-disciplinary family meet or Palliative Care meeting due by:  day 7   Steve Minor ACNP Maryanna Shape PCCM Pager [phone] till 3 pm If no answer page [phone] 11/25/2015, 2:04 PM   STAFF NOTE: I, Merrie Roof, MD FACP have personally reviewed patient's available data, including medical history, events of note, physical examination and test results as part of my evaluation. I have discussed with resident/NP and other care providers such as pharmacist, RN and RRT. In addition, I personally evaluated patient and  elicited key findings of: lethargic, minimal rigidity with lowers, neck and uppers not rigid, did have mild fever, lungs clear, abdo soft, differential includes NMS mild, reactive tardives dyscinesia, dc all antipsychotics, start benadryl scheduled, get stat head CT if no mass then I will LP, add empiric ceftriaxone, vanc, neuro feels want acyclovir, will order, send HIV, admit icu for airway protection, does not have sirs criteria met, pos balance goals, dropplet, to neuro icu, if all neg would MRI The patient is critically ill with multiple organ systems failure and requires high complexity decision making for assessment and support, frequent evaluation and titration of therapies, application of advanced monitoring technologies and extensive interpretation of multiple databases.   Critical Care Time devoted to patient care services described in this note is 35 Minutes. This time reflects time of care of this signee: Merrie Roof, MD FACP. This critical care time does not reflect procedure time, or teaching time or supervisory time of PA/NP/Med student/Med Resident etc but could involve care discussion time. Rest  per NP/medical resident whose note is outlined above and that I agree with   Lavon Paganini. Titus Mould, MD, [place] Pgr: [facility] 11/25/2015 2:35 PM

## 2015-11-25 NOTE — ED Notes (Signed)
Came to draw blood pt going to xray

## 2015-11-25 NOTE — Consult Note (Signed)
[facility] Consult   Reason for Consult:  Antipsychotic medication reaction (Prolixin) Referring Physician:  EDP Patient Identification: Rodney Chandler MRN:  [id number] Principal Diagnosis: NMS (neuroleptic malignant syndrome) Diagnosis:   Patient Active Problem List   Diagnosis Date Noted  . NMS (neuroleptic malignant syndrome) [G21.0] 11/25/2015    Priority: High  . Homicidal ideation [R45.850]   . Mania ([place]) [F30.9]   . Bipolar affective disorder, current episode manic with psychotic symptoms ([place]) [F31.2] 09/17/2015  . Cannabis use disorder, severe, dependence ([place]) [F12.20] 09/17/2015  . Concussion with loss of consciousness <= 30 min [S06.0X1A] 09/17/2013  . Acute respiratory failure ([place]) [J96.00] 09/17/2013  . 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 Medication management [Z79.899] 08/31/2013    Total Time spent with patient: 45 minutes  Subjective:   Rodney Chandler is a 30 y.o. male patient with signs and symptoms of neuroleptic malignant syndrome (NMS):  Muscle rigidity, difficulty swallowing, elevated temperature of 101.1 axillary, sensory changes, seizure prior to arrival in ED.  HPI:  30 yo male who presented to the ED after a seizure.  He was discharged from [facility] on Thursday on Prolixin.  Mr. Janssen had also been using some alcohol and marijuana, according to his mother.  Patient is not able to answer questions as he is incoherent at this time.  EDP notified, CK ordered along with Ativan 2 mg IM and Benadryl 50 mg IM, ICU admission requested as soon as possible for high probability of NMS.  Past Psychiatric History: Schizophrenia  Risk to Self: Is patient at risk for suicide?: No Risk to Others:   Prior Inpatient Therapy:   Prior Outpatient Therapy:    Past Medical History:  Past Medical History  Diagnosis Date  . Bipolar 1 disorder ([place])   . Manic depression ([place])   . Schizophrenia ([place])   . Mandibular fracture ([place]) 09/17/2013     Past Surgical History  Procedure Laterality Date  . Orif mandibular fracture N/A 09/17/2013    Procedure: OPEN REDUCTION INTERNAL FIXATION (ORIF) MANDIBULAR FRACTURE;  Surgeon: Ascencion Dike, MD;  Location: [place];  Service: ENT;  Laterality: N/A;  . Mouth surgery    . Mandibular hardware removal N/A 10/18/2013    Procedure: MANDIBULAR HARDWARE REMOVAL;  Surgeon: Ascencion Dike, MD;  Location: [place];  Service: ENT;  Laterality: N/A;   Family History: No family history on file. Family Psychiatric  History: None Social History:  History  Alcohol Use  . Yes    Comment: Weekends.      History  Drug Use  . 14.00 per week  . Special: Marijuana    Comment: Last used: 3 hours ago    Social History   Social History  . Marital Status: Single    Spouse Name: N/A  . Number of Children: N/A  . Years of Education: N/A   Social History Main Topics  . Smoking status: Former Smoker -- 0.50 packs/day    Types: Cigarettes  . Smokeless tobacco: Never Used  . Alcohol Use: Yes     Comment: Weekends.   . Drug Use: 14.00 per week    Special: Marijuana     Comment: Last used: 3 hours ago  . Sexual Activity: Not Asked   Other Topics Concern  . None   Social History Narrative   Additional Social History:    Allergies:   Allergies  Allergen Reactions  . Prolixin [Fluphenazine] Other (See Comments)    Slurred  speech, foam at the mouth, spit   . Lithium Other (See Comments)    NOSEBLEED  . Depakote [Divalproex Sodium] Other (See Comments)    NOSEBLEED  . Zyprexa [Olanzapine] Other (See Comments)    NOSEBLEED    Labs:  Results for orders placed or performed during the hospital encounter of 11/25/15 (from the past 48 hour(s))  Urine rapid drug screen (hosp performed)     Status: Abnormal   Collection Time: 11/25/15  7:30 AM  Result Value Ref Range   Opiates NONE DETECTED NONE DETECTED   Cocaine NONE DETECTED NONE DETECTED   Benzodiazepines NONE DETECTED NONE  DETECTED   Amphetamines NONE DETECTED NONE DETECTED   Tetrahydrocannabinol POSITIVE (A) NONE DETECTED   Barbiturates NONE DETECTED NONE DETECTED    Comment:        DRUG SCREEN FOR MEDICAL PURPOSES ONLY.  IF CONFIRMATION IS NEEDED FOR ANY PURPOSE, NOTIFY LAB WITHIN 5 DAYS.        LOWEST DETECTABLE LIMITS FOR URINE DRUG SCREEN Drug Class       Cutoff (ng/mL) Amphetamine      1000 Barbiturate      200 Benzodiazepine   672 Tricyclics       094 Opiates          300 Cocaine          300 THC              50   Urinalysis, Routine w reflex microscopic (not at [facility])     Status: None   Collection Time: 11/25/15  7:30 AM  Result Value Ref Range   Color, Urine YELLOW YELLOW   APPearance CLEAR CLEAR   Specific Gravity, Urine 1.007 1.005 - 1.030   pH 6.5 5.0 - 8.0   Glucose, UA NEGATIVE NEGATIVE mg/dL   Hgb urine dipstick NEGATIVE NEGATIVE   Bilirubin Urine NEGATIVE NEGATIVE   Ketones, ur NEGATIVE NEGATIVE mg/dL   Protein, ur NEGATIVE NEGATIVE mg/dL   Nitrite NEGATIVE NEGATIVE   Leukocytes, UA NEGATIVE NEGATIVE    Comment: MICROSCOPIC NOT DONE ON URINES WITH NEGATIVE PROTEIN, BLOOD, LEUKOCYTES, NITRITE, OR GLUCOSE <1000 mg/dL.  Comprehensive metabolic panel     Status: Abnormal   Collection Time: 11/25/15  7:30 AM  Result Value Ref Range   Sodium 139 135 - 145 mmol/L   Potassium 4.0 3.5 - 5.1 mmol/L   Chloride 102 101 - 111 mmol/L   CO2 28 22 - 32 mmol/L   Glucose, Bld 80 65 - 99 mg/dL   BUN 6 6 - 20 mg/dL   Creatinine, Ser 0.83 0.61 - 1.24 mg/dL   Calcium 9.9 8.9 - 10.3 mg/dL   Total Protein 7.6 6.5 - 8.1 g/dL   Albumin 4.4 3.5 - 5.0 g/dL   AST 47 (H) 15 - 41 U/L   ALT 74 (H) 17 - 63 U/L   Alkaline Phosphatase 67 38 - 126 U/L   Total Bilirubin 0.9 0.3 - 1.2 mg/dL   GFR calc non Af Amer >60 >60 mL/min   GFR calc Af Amer >60 >60 mL/min    Comment: (NOTE) The eGFR has been calculated using the CKD EPI equation. This calculation has not been validated in all clinical  situations. eGFR's persistently <60 mL/min signify possible Chronic Kidney Disease.    Anion gap 9 5 - 15  CBC with Differential/Platelet     Status: Abnormal   Collection Time: 11/25/15  7:30 AM  Result Value Ref Range   WBC 11.5 (  H) 4.0 - 10.5 K/uL   RBC 4.09 (L) 4.22 - 5.81 MIL/uL   Hemoglobin 13.2 13.0 - 17.0 g/dL   HCT 40.0 39.0 - 52.0 %   MCV 97.8 78.0 - 100.0 fL   MCH 32.3 26.0 - 34.0 pg   MCHC 33.0 30.0 - 36.0 g/dL   RDW 13.3 11.5 - 15.5 %   Platelets 269 150 - 400 K/uL   Neutrophils Relative % 76 %   Neutro Abs 8.8 (H) 1.7 - 7.7 K/uL   Lymphocytes Relative 12 %   Lymphs Abs 1.4 0.7 - 4.0 K/uL   Monocytes Relative 11 %   Monocytes Absolute 1.2 (H) 0.1 - 1.0 K/uL   Eosinophils Relative 1 %   Eosinophils Absolute 0.1 0.0 - 0.7 K/uL   Basophils Relative 0 %   Basophils Absolute 0.0 0.0 - 0.1 K/uL  Ethanol     Status: None   Collection Time: 11/25/15  7:31 AM  Result Value Ref Range   Alcohol, Ethyl (B) <5 <5 mg/dL    Comment:        LOWEST DETECTABLE LIMIT FOR SERUM ALCOHOL IS 5 mg/dL FOR MEDICAL PURPOSES ONLY   I-Stat CG4 Lactic Acid, ED     Status: None   Collection Time: 11/25/15  7:40 AM  Result Value Ref Range   Lactic Acid, Venous 1.53 0.5 - 2.0 mmol/L    Current Facility-Administered Medications  Medication Dose Route Frequency Provider Last Rate Last Dose  . acetaminophen (TYLENOL) tablet 650 mg  650 mg Oral Q4H PRN Leonard Schwartz, MD      . alum & mag hydroxide-simeth (MAALOX/MYLANTA) 200-200-20 MG/5ML suspension 30 mL  30 mL Oral PRN Leonard Schwartz, MD      . diphenhydrAMINE (BENADRYL) injection 50 mg  50 mg Intramuscular Once Patrecia Pour, NP      . LORazepam (ATIVAN) injection 1 mg  1 mg Intravenous Q4H Leonard Schwartz, MD      . LORazepam (ATIVAN) injection 2 mg  2 mg Intramuscular Once Patrecia Pour, NP      . nicotine (NICODERM CQ - dosed in mg/24 hours) patch 21 mg  21 mg Transdermal Daily Leonard Schwartz, MD   21 mg at 11/25/15 0925  . ondansetron  (ZOFRAN) tablet 4 mg  4 mg Oral Q8H PRN Leonard Schwartz, MD      . zolpidem [facility]) tablet 5 mg  5 mg Oral QHS PRN Leonard Schwartz, MD       Current Outpatient Prescriptions  Medication Sig Dispense Refill  . amantadine (SYMMETREL) 100 MG capsule Take 1 capsule (100 mg total) by mouth 2 (two) times daily. (Patient not taking: Reported on 10/29/2015) 60 capsule 0  . diphenhydramine-acetaminophen (TYLENOL PM) 25-500 MG TABS tablet Take 2 tablets by mouth daily as needed (for sleep).    . fluPHENAZine (PROLIXIN) 10 MG tablet Take 10 mg by mouth 2 (two) times daily. Take with 63m tablet to make 143m   . fluPHENAZine (PROLIXIN) 5 MG tablet Take 5 mg by mouth 2 (two) times daily. Takes with 1056mablet to make 55m67m . haloperidol (HALDOL) 5 MG tablet Take 1 tablet (5 mg total) by mouth 2 (two) times daily. (Patient not taking: Reported on 11/24/2015) 60 tablet 0  . LORazepam (ATIVAN) 1 MG tablet Take 1 tablet (1 mg total) by mouth 2 (two) times daily as needed for anxiety. 8 tablet 0  . Oxcarbazepine (TRILEPTAL) 300 MG tablet Take 1 tablet (300 mg  total) by mouth 2 (two) times daily. (Patient not taking: Reported on 10/29/2015) 60 tablet 0  . traZODone (DESYREL) 100 MG tablet Take 1 tablet (100 mg total) by mouth at bedtime. (Patient not taking: Reported on 11/24/2015) 30 tablet 0  . traZODone (DESYREL) 50 MG tablet Take 50 mg by mouth at bedtime.      Musculoskeletal: Strength & Muscle Tone: stiff muscles Gait & Station: patient did not stand, not stable Patient leans: N/A  Psychiatric Specialty Exam: Review of Systems  Constitutional: Positive for fever and diaphoresis.  HENT: Negative.   Eyes: Negative.   Respiratory: Negative.   Cardiovascular: Negative.   Gastrointestinal: Negative.   Genitourinary: Negative.   Musculoskeletal: Positive for myalgias.  Skin: Negative.   Neurological: Positive for sensory change and seizures.  Endo/Heme/Allergies: Negative.   Psychiatric/Behavioral:  Positive for substance abuse.    Blood pressure 119/57, pulse 68, temperature 101.1 F (38.4 C), temperature source Axillary, resp. rate 22, SpO2 99 %.There is no weight on file to calculate BMI.  General Appearance: Casual  Eye Contact::  Minimal  Speech:  Blocked  Volume:  Decreased  Mood:  confused neurological, difficulty to assess  Affect:  Blunt  Thought Process:  incoherent  Orientation:  Other:  person   Thought Content:  difficult to assess due to his sensory change  Suicidal Thoughts:  No  Homicidal Thoughts:  No  Memory:  Immediate, recent, and remote--unable to assess  Judgement:  Impaired  Insight:  unable to assess  Psychomotor Activity:  Decreased  Concentration:  Poor  Recall:  unable to assess  Fund of Knowledge:unable to assess  Language: Poor due to his current state  Akathisia:  unable to assess  Handed:  Right  AIMS (if indicated):     Assets:  Housing Leisure Time Resilience Social Support  ADL's:  Intact  Cognition: Impaired,  Severe  Sleep:      Treatment Plan Summary: Daily contact with patient to assess and evaluate symptoms and progress in treatment, Medication management and Plan neuroleptic malignant syndrome:  -Crisis stabilization -Medication management:  Ativan 2 mg IM and Benadryl 50 mg IM for muscle rigidity and difficulty swallowing, antipsychotics stopped -Consult with medical ED staff for medical admission, as soon as possible  Disposition: Admit to medical   Waylan Boga, NP 11/25/2015 10:50 AM  Patient seen and I agree with treatment and plan  Levonne Spiller M.D.

## 2015-11-25 NOTE — Progress Notes (Signed)
Alerted by rn on 15m about patient47m not receiving any abx as ordered by pharmacy. RN is to safety zone this event. I adjusted times to allow for correct charting  Isaac Bliss, PharmD, BCPS, [facility] Clinical Pharmacist Pager [phone] 11/25/2015 8:22 PM

## 2015-11-25 NOTE — ED Notes (Signed)
Pt unable to sign transfer form, not responsive to verbal or painful stimuli. Dr. Radford Pax at bedside, vital signs stable at this time, pt maintaining his airway. Dr. Radford Pax reporting that pt has been given several doses of ativan resulting in above behavior. Will continue to monitor patient closely.

## 2015-11-25 NOTE — Progress Notes (Signed)
Pharmacy Antibiotic Note  Rodney Chandler is a 30 y.o. male admitted on 11/25/2015 with nuchal rigidity, some concern for NMS as well but r/o meningitis.  Pharmacy has been consulted for acyclovir and vancomycin dosing.  Plan: -Vancomycin  IV x1, then Vancomycin  IV q8h. Goal trough 15-20 mcg/mL -acyclovir  IV q8h -adjusted ceftriaxone to 2g IV q12h for CNS dosing      Temp (24hrs), Avg:100.1 F (37.8 C), Min:99.1 F (37.3 C), Max:101.1 F (38.4 C)   Recent Labs Lab 11/25/15 0730 11/25/15 0740 11/25/15 1230  WBC 11.5*  --   --   CREATININE 0.83  --   --   LATICACIDVEN  --  1.53 2.59*    Estimated Creatinine Clearance: 127.4 mL/min (by C-G formula based on Cr of 0.83).    Allergies  Allergen Reactions  . Prolixin [Fluphenazine] Other (See Comments)    Slurred speech, foam at the mouth, spit   . Lithium Other (See Comments)    NOSEBLEED  . Depakote [Divalproex Sodium] Other (See Comments)    NOSEBLEED  . Zyprexa [Olanzapine] Other (See Comments)    NOSEBLEED    Antimicrobials this admission: vancomycin 2/12 >>  acyclovir 2/12 >>  Ceftriaxone 2/12>>  Dose adjustments this admission: N/A  Microbiology results: 2/12 BCx: sent 2/12 CSF: ordered   Thank you for allowing pharmacy to be a part of this patient's care.  Vishaal Strollo D. Carold Eisner, PharmD, BCPS Clinical Pharmacist Pager: [phone] 11/25/2015 2:45 PM
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
## 2015-11-25 NOTE — ED Notes (Signed)
Notified Charge RN that pt will be transferred via [facility] to ED accepted by Dr Adela Lank

## 2015-11-25 NOTE — ED Provider Notes (Signed)
CSN: [id number]     Arrival date & time 11/25/15  0702 History   First MD Initiated Contact with Patient 11/25/15 [phone]     Chief Complaint  Patient presents with  . Seizures      HPI Patient had recently been started on Prolixin by psychiatrist.  Was seen in the emergency department [facility] yesterday and diagnosed with tardive dyskinesia.  Was brought in today because of possible seizure activity.  Patient has no known history of seizure activity.  Was not incontinent of urine or feces.  When he arrived here he was awake alert but had difficulty standing and was drooling constantly with a constant tremor to chin and both hands.  Very stiff to examination. Past Medical History  Diagnosis Date  . Bipolar 1 disorder (HCC)   . Manic depression (HCC)   . Schizophrenia (HCC)   . Mandibular fracture (HCC) 09/17/2013   Past Surgical History  Procedure Laterality Date  . Orif mandibular fracture N/A 09/17/2013    Procedure: OPEN REDUCTION INTERNAL FIXATION (ORIF) MANDIBULAR FRACTURE;  Surgeon: Darletta Moll, MD;  Location: [facility] OR;  Service: ENT;  Laterality: N/A;  . Mouth surgery    . Mandibular hardware removal N/A 10/18/2013    Procedure: MANDIBULAR HARDWARE REMOVAL;  Surgeon: Darletta Moll, MD;  Location: [facility];  Service: ENT;  Laterality: N/A;   No family history on file. Social History  Substance Use Topics  . Smoking status: Former Smoker -- 0.50 packs/day    Types: Cigarettes  . Smokeless tobacco: Never Used  . Alcohol Use: Yes     Comment: Weekends.     Review of Systems  Unable to perform ROS: Psychiatric disorder      Allergies  Prolixin; Lithium; Depakote; and Zyprexa  Home Medications   Prior to Admission medications   Medication Sig Start Date End Date Taking? Authorizing Provider  fluPHENAZine (PROLIXIN) 10 MG tablet Take 10 mg by mouth 2 (two) times daily. Take with 5mg  tablet to make 15mg    Yes Historical Provider, MD  fluPHENAZine  (PROLIXIN) 5 MG tablet Take 5 mg by mouth 2 (two) times daily. Takes with 10mg  tablet to make 15mg    Yes Historical Provider, MD  traZODone (DESYREL) 50 MG tablet Take 50 mg by mouth at bedtime.   Yes Historical Provider, MD  amantadine (SYMMETREL) 100 MG capsule Take 1 capsule (100 mg total) by mouth 2 (two) times daily. Patient not taking: Reported on 10/29/2015 09/20/15   Earney Navy, NP  diphenhydramine-acetaminophen (TYLENOL PM) 25-500 MG TABS tablet Take 2 tablets by mouth daily as needed (for sleep). Reported on 11/25/2015    Historical Provider, MD  haloperidol (HALDOL) 5 MG tablet Take 1 tablet (5 mg total) by mouth 2 (two) times daily. Patient not taking: Reported on 11/24/2015 09/20/15   Earney Navy, NP  LORazepam (ATIVAN) 1 MG tablet Take 1 tablet (1 mg total) by mouth 2 (two) times daily as needed for anxiety. 11/24/15   Richardean Canal, MD  Oxcarbazepine (TRILEPTAL) 300 MG tablet Take 1 tablet (300 mg total) by mouth 2 (two) times daily. Patient not taking: Reported on 10/29/2015 09/20/15   Earney Navy, NP  traZODone (DESYREL) 100 MG tablet Take 1 tablet (100 mg total) by mouth at bedtime. Patient not taking: Reported on 11/24/2015 09/20/15   Earney Navy, NP   BP 117/72 mmHg  Pulse 57  Temp(Src) 97.9 F (36.6 C) (Oral)  Resp 18  Ht 5\' 11"  (1.803 m)  Wt 144 lb 4.8 oz (65.454 kg)  BMI 20.13 kg/m2  SpO2 100% Physical Exam  Constitutional: He appears well-developed and well-nourished. No distress.  HENT:  Head: Normocephalic and atraumatic.  Eyes: Pupils are equal, round, and reactive to light.  Neck: Normal range of motion.  Cardiovascular: Normal rate and intact distal pulses.   Pulmonary/Chest: No respiratory distress.  Abdominal: Normal appearance. He exhibits no distension.  Musculoskeletal: Normal range of motion.  Neurological: He is alert. He displays tremor. No cranial nerve deficit. Coordination and gait abnormal. GCS eye subscore is 4. GCS verbal  subscore is 5. GCS motor subscore is 6.  Patient is drooling with a constant tremor to lower chin.  Skin: Skin is warm and dry. No rash noted.  Psychiatric: He has a normal mood and affect. His behavior is normal.  Nursing note and vitals reviewed.   ED Course  Procedures (including critical care time) Labs Review Labs Reviewed  URINE RAPID DRUG SCREEN, HOSP PERFORMED - Abnormal; Notable for the following:    Tetrahydrocannabinol POSITIVE (*)    All other components within normal limits  COMPREHENSIVE METABOLIC PANEL - Abnormal; Notable for the following:    AST 47 (*)    ALT 74 (*)    All other components within normal limits  CBC WITH DIFFERENTIAL/PLATELET - Abnormal; Notable for the following:    WBC 11.5 (*)    RBC 4.09 (*)    Neutro Abs 8.8 (*)    Monocytes Absolute 1.2 (*)    All other components within normal limits  CSF CELL COUNT WITH DIFFERENTIAL - Abnormal; Notable for the following:    Appearance, CSF CLEAR (*)    RBC Count, CSF 39 (*)    All other components within normal limits  CSF CELL COUNT WITH DIFFERENTIAL - Abnormal; Notable for the following:    Color, CSF PINK (*)    Appearance, CSF HAZY (*)    RBC Count, CSF 1725 (*)    All other components within normal limits  COMPREHENSIVE METABOLIC PANEL - Abnormal; Notable for the following:    Glucose, Bld 100 (*)    BUN <5 (*)    AST 43 (*)    ALT 69 (*)    All other components within normal limits  CBC WITH DIFFERENTIAL/PLATELET - Abnormal; Notable for the following:    RBC 4.12 (*)    Monocytes Absolute 1.5 (*)    All other components within normal limits  CBC - Abnormal; Notable for the following:    RBC 4.10 (*)    All other components within normal limits  BASIC METABOLIC PANEL - Abnormal; Notable for the following:    Glucose, Bld 123 (*)    All other components within normal limits  BLOOD GAS, ARTERIAL - Abnormal; Notable for the following:    Bicarbonate 24.2 (*)    All other components within  normal limits  CK - Abnormal; Notable for the following:    Total CK 967 (*)    All other components within normal limits  PROTEIN AND GLUCOSE, CSF - Abnormal; Notable for the following:    Glucose, CSF 72 (*)    All other components within normal limits  GLUCOSE, CAPILLARY - Abnormal; Notable for the following:    Glucose-Capillary 121 (*)    All other components within normal limits  GLUCOSE, CAPILLARY - Abnormal; Notable for the following:    Glucose-Capillary 102 (*)    All other components within normal  limits  CBC WITH DIFFERENTIAL/PLATELET - Abnormal; Notable for the following:    Monocytes Absolute 1.1 (*)    All other components within normal limits  I-STAT CG4 LACTIC ACID, ED - Abnormal; Notable for the following:    Lactic Acid, Venous 2.59 (*)    All other components within normal limits  CULTURE, BLOOD (ROUTINE X 2)  CULTURE, BLOOD (ROUTINE X 2)  CSF CULTURE  CULTURE, BLOOD (ROUTINE X 2)  CULTURE, BLOOD (ROUTINE X 2)  MRSA PCR SCREENING  GRAM STAIN  URINALYSIS, ROUTINE W REFLEX MICROSCOPIC (NOT AT [facility])  ETHANOL  CK  IRON  HIV ANTIBODY (ROUTINE TESTING)  APTT  PROTIME-INR  PROCALCITONIN  LACTIC ACID, PLASMA  URINALYSIS, ROUTINE W REFLEX MICROSCOPIC (NOT AT [facility])  GLUCOSE, CAPILLARY  MAGNESIUM  PHOSPHORUS  GLUCOSE, CAPILLARY  PROCALCITONIN  GLUCOSE, CAPILLARY  PROTEIN AND GLUCOSE, CSF  HERPES SIMPLEX VIRUS(HSV) DNA BY PCR  CK TOTAL AND CKMB (NOT AT [facility])  BASIC METABOLIC PANEL  MAGNESIUM  I-STAT CG4 LACTIC ACID, ED  I-STAT CG4 LACTIC ACID, ED  I-STAT CG4 LACTIC ACID, ED   CRITICAL CARE Performed by: Nelva Nay L Total critical care time: 30 minutes Critical care time was exclusive of separately billable procedures and treating other patients. Critical care was necessary to treat or prevent imminent or life-threatening deterioration. Critical care was time spent personally by me on the following activities: development of treatment plan with  patient and/or surrogate as well as nursing, discussions with consultants, evaluation of patient's response to treatment, examination of patient, obtaining history from patient or surrogate, ordering and performing treatments and interventions, ordering and review of laboratory studies, ordering and review of radiographic studies, pulse oximetry and re-evaluation of patient's condition.  Imaging Review Dg Chest 2 View  11/25/2015  CLINICAL DATA:  30 year old male with acute chest pain following seizure. EXAM: CHEST  2 VIEW COMPARISON:  04/26/2015 chest radiograph FINDINGS: The cardiomediastinal silhouette is unremarkable. There is no evidence of focal airspace disease, pulmonary edema, suspicious pulmonary nodule/mass, pleural effusion, or pneumothorax. No acute bony abnormalities are identified. IMPRESSION: No active cardiopulmonary disease. Electronically Signed   By: Harmon Pier M.D.   On: 11/25/2015 11:25   Ct Head Wo Contrast  11/25/2015  CLINICAL DATA:  Seizure this morning with altered mental status EXAM: CT HEAD WITHOUT CONTRAST TECHNIQUE: Contiguous axial images were obtained from the base of the skull through the vertex without intravenous contrast. COMPARISON:  September 17, 2013 FINDINGS: The ventricles are normal in size and configuration. There is no intracranial mass, hemorrhage, extra-axial fluid collection, or midline shift. The gray-white compartments appear normal. No acute infarct evident. The bony calvarium appears intact. The mastoid air cells are clear. No intraorbital lesions are identified. There is leftward deviation of the nasal septum. There is right nasal turbinate edema. IMPRESSION: Right nasal turbinate edema with narrowing of the right naris. Leftward deviation the nasal septum. No intracranial mass, hemorrhage, or extra-axial fluid collection. Gray-white compartments appear normal. Electronically Signed   By: Bretta Bang III M.D.   On: 11/25/2015 15:41   I have  personally reviewed and evaluated these images and lab results as part of my medical decision-making.  After the patient arrived in psychiatric ED he spiked at temperature to 101.1.  In light of recent history and medications, neuro malignant syndrome is considered a likely possibility.  Patient will be treated with benzodiazepines.  I spoke with the hospitalist, neurology, in the intensive care physicians.  Patient will be transferred to the  emergency department at [facility] where he will be evaluated by neurology and intensive care medicine.  After those consults ultimate disposition will be made.  Patient was stable for discharge and transfer.  MDM   Final diagnoses:  NMS (neuroleptic malignant syndrome)        Nelva Nay, MD 11/27/15 [phone]

## 2015-11-25 NOTE — ED Notes (Signed)
Bed: ZO10 Expected date:  Expected time:  Means of arrival:  Comments: Hold for 17

## 2015-11-25 NOTE — Procedures (Signed)
Lumbar Puncture Procedure Note  Pre-operative Diagnosis: r/o enceph, meningitis  Post-operative Diagnosis: r/o encweph  Indications: Diagnostic  Procedure Details   Consent: Informed consent was obtained. Risks of the procedure were discussed including: infection, bleeding, pain and headache.  The patient was positioned under sterile conditions. Betadine solution and sterile drapes were utilized. A spinal needle was inserted at the L2 - L3 interspace.  Spinal fluid was obtained and sent to the laboratory.  Findings            mild traumatic--> cleared 8mL of clear spinal fluid was obtained. Opening Pressure: wnlcm H2O pressure. Closing Pressure: wnlcm H2O pressure.  Complications:  None; patient tolerated the procedure well.        Condition: stable  Plan Bed rest for 5 hours.Rodney Chandler. Rodney Alias, MD, FACP Pgr: [phone] [facility]

## 2015-11-26 DIAGNOSIS — F250 Schizoaffective disorder, bipolar type: Secondary | ICD-10-CM

## 2015-11-26 DIAGNOSIS — F1220 Cannabis dependence, uncomplicated: Secondary | ICD-10-CM

## 2015-11-26 DIAGNOSIS — R748 Abnormal levels of other serum enzymes: Secondary | ICD-10-CM

## 2015-11-26 LAB — URINALYSIS, ROUTINE W REFLEX MICROSCOPIC
Bilirubin Urine: NEGATIVE
GLUCOSE, UA: NEGATIVE mg/dL
HGB URINE DIPSTICK: NEGATIVE
Ketones, ur: NEGATIVE mg/dL
LEUKOCYTES UA: NEGATIVE
Nitrite: NEGATIVE
PROTEIN: NEGATIVE mg/dL
SPECIFIC GRAVITY, URINE: 1.012 (ref 1.005–1.030)
pH: 7.5 (ref 5.0–8.0)

## 2015-11-26 LAB — BASIC METABOLIC PANEL
Anion gap: 13 (ref 5–15)
BUN: 6 mg/dL (ref 6–20)
CHLORIDE: 101 mmol/L (ref 101–111)
CO2: 24 mmol/L (ref 22–32)
CREATININE: 0.93 mg/dL (ref 0.61–1.24)
Calcium: 9 mg/dL (ref 8.9–10.3)
GFR calc non Af Amer: >60 mL/min (ref >60)
Glucose, Bld: 123 mg/dL — ABNORMAL HIGH (ref 65–99)
POTASSIUM: 3.7 mmol/L (ref 3.5–5.1)
SODIUM: 138 mmol/L (ref 135–145)

## 2015-11-26 LAB — BLOOD GAS, ARTERIAL
ACID-BASE EXCESS: 0.5 mmol/L (ref 0.0–2.0)
Bicarbonate: 24.2 mEq/L — ABNORMAL HIGH (ref 20.0–24.0)
DRAWN BY: 441381
FIO2: 0.21
O2 Saturation: 96.3 %
PATIENT TEMPERATURE: 98.6
TCO2: 25.3 mmol/L (ref 0–100)
pCO2 arterial: 35.8 mmHg (ref 35.0–45.0)
pH, Arterial: 7.445 (ref 7.350–7.450)
pO2, Arterial: 82.9 mmHg (ref 80.0–100.0)

## 2015-11-26 LAB — CBC
HEMATOCRIT: 39.6 % (ref 39.0–52.0)
HEMOGLOBIN: 13.1 g/dL (ref 13.0–17.0)
MCH: 32 pg (ref 26.0–34.0)
MCHC: 33.1 g/dL (ref 30.0–36.0)
MCV: 96.6 fL (ref 78.0–100.0)
Platelets: 236 10*3/uL (ref 150–400)
RBC: 4.1 MIL/uL — AB (ref 4.22–5.81)
RDW: 13.1 % (ref 11.5–15.5)
WBC: 8.2 10*3/uL (ref 4.0–10.5)

## 2015-11-26 LAB — GLUCOSE, CAPILLARY
Glucose-Capillary: 102 mg/dL — ABNORMAL HIGH (ref 65–99)
Glucose-Capillary: 77 mg/dL (ref 65–99)

## 2015-11-26 LAB — MAGNESIUM: MAGNESIUM: 1.8 mg/dL (ref 1.7–2.4)

## 2015-11-26 LAB — HIV ANTIBODY (ROUTINE TESTING W REFLEX): HIV Screen 4th Generation wRfx: NONREACTIVE

## 2015-11-26 LAB — PROCALCITONIN: Procalcitonin: <0.1 ng/mL

## 2015-11-26 LAB — CK: CK TOTAL: 967 U/L — AB (ref 49–397)

## 2015-11-26 LAB — MRSA PCR SCREENING: MRSA by PCR: NEGATIVE

## 2015-11-26 LAB — PHOSPHORUS: PHOSPHORUS: 3.9 mg/dL (ref 2.5–4.6)

## 2015-11-26 MED ORDER — ARIPIPRAZOLE 10 MG PO TABS
5.0000 mg | ORAL_TABLET | Freq: Two times a day (BID) | ORAL | Status: DC
  Administered 2015-11-26 – 2015-11-27 (×3): 5 mg via ORAL
  Filled 2015-11-26 (×5): qty 1

## 2015-11-26 MED ORDER — BENZTROPINE MESYLATE 0.5 MG PO TABS: 0.5000 mg | ORAL_TABLET | Freq: Two times a day (BID) | ORAL | Status: DC | PRN

## 2015-11-26 NOTE — Consult Note (Signed)
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 Chase Caller Patient Identification: Rodney Chandler MRN:  [id number] Principal Diagnosis: NMS (neuroleptic malignant syndrome) Diagnosis:   Patient Active Problem List   Diagnosis Date Noted  . NMS (neuroleptic malignant syndrome) [G21.0] 11/25/2015  . Change in mental state [R41.82] 11/25/2015  . Homicidal ideation [R45.850]   . Mania ([place]) [F30.9]   . Bipolar affective disorder, current episode manic with psychotic symptoms ([place]) [F31.2] 09/17/2015  . Cannabis use disorder, severe, dependence ([place]) [F12.20] 09/17/2015  . Concussion with loss of consciousness <= 30 min [S06.0X1A] 09/17/2013  . Acute respiratory failure ([place]) [J96.00] 09/17/2013  . 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 Medication management [Z79.899] 08/31/2013    Total Time spent with patient: 45 minutes  Subjective:   Rodney Chandler is a 30 y.o. male patient with signs and symptoms of neuroleptic malignant syndrome (NMS):  Muscle rigidity, difficulty swallowing, elevated temperature of 101.1 axillary, sensory changes, seizure prior to arrival in ED.  HPI: Rodney Chandler 30 yo male seen, chart reviewed and case discussed with Dr. Chase Caller for face-to-face psychiatric consultation and evaluation of recent treatment of schizoaffective disorder with antipsychotic medication Haldol and Prolixin while admitted to [facility] in [place] and discharged on who p 11/22/2015. Patient was initially presented with abnormal movements which seems to be seizures-like episode and later found to muscle rigidity, difficulty swallowing, increased temperature, sensory changes which required admission to medical intensive care unit at [facility]. Patient antipsychotic medication or discontinued and started treatment with intramuscular injections of Ativan 2 mg  and Benadryl 50 mg along with supportive therapy. Patient positively responded to the above treatment and currently has some muscle cramps and mild muscle rigidity and restlessness. 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 Patient also known for substance abuse especially marijuana and alcohol. Patient denies current symptoms of depression, anxiety, paranoia, delusions, auditory and visual hallucinations. Patient seems to be hypomanic with the grandiose thoughts and restlessness. Patient is initially willing to stay awake atypical antipsychotic which is less risk for the neuroleptic malignant syndrome and in few minutes he stated he does not need any medication. Patient has a history of noncompliant with his medication management and receiving outpatient psychiatric services from the [facility] mental health.  Past Psychiatric Histopatient has been diagnosed with schizoaffective disorder, polysubstance abuse and noncompliance and the previous multiple visits to the [facility] emergency department.  Risk to Self: Is patient at risk for suicide?: No Risk to Others:   Prior Inpatient Therapy:   Prior Outpatient Therapy:    Past Medical History:  Past Medical History  Diagnosis Date  . Bipolar 1 disorder ([place])   . Manic depression ([place])   . Schizophrenia ([place])   . Mandibular fracture ([place]) 09/17/2013    Past Surgical History  Procedure Laterality Date  . Orif mandibular fracture N/A 09/17/2013    Procedure: OPEN REDUCTION INTERNAL FIXATION (ORIF) MANDIBULAR FRACTURE;  Surgeon: Ascencion Dike, MD;  Location: [place];  Service: ENT;  Laterality: N/A;  . Mouth surgery    . Mandibular hardware removal N/A 10/18/2013    Procedure: MANDIBULAR HARDWARE REMOVAL;  Surgeon: Ascencion Dike, MD;  Location: [place];  Service: ENT;  Laterality: N/A;   Family History: No family history on file. Family Psychiatric  History: None Social History:  History  Alcohol Use  . Yes    Comment: Weekends.      History  Drug Use  . 14.00 per week  . Special: Marijuana    Comment: Last used: 3 hours ago    Social History   Social History  . Marital Status: Single    Spouse Name: N/A  . Number of Children: N/A  . Years of Education: N/A   Social History Main Topics  . Smoking status: Former Smoker -- 0.50 packs/day    Types: Cigarettes  . Smokeless tobacco: Never Used  . Alcohol Use: Yes     Comment: Weekends.   . Drug Use: 14.00 per week    Special: Marijuana     Comment: Last used: 3 hours ago  . Sexual Activity: Not Asked   Other Topics Concern  . None   Social History Narrative   Additional Social History: Patient stated he is living with a friend and planning to lease an apartment  and by a car.     Allergies:   Allergies  Allergen Reactions  . Prolixin [Fluphenazine] Other (See Comments)    Slurred speech, foam at the mouth, spit   . Lithium Other (See Comments)    NOSEBLEED  . Depakote [Divalproex Sodium] Other (See Comments)    NOSEBLEED  . Zyprexa [Olanzapine] Other (See Comments)    NOSEBLEED    Labs:  Results for orders placed or performed during the hospital encounter of 11/25/15 (from the past 48 hour(s))  Urine rapid drug screen (hosp performed)     Status: Abnormal   Collection Time: 11/25/15  7:30 AM  Result Value Ref Range   Opiates NONE DETECTED NONE DETECTED   Cocaine NONE DETECTED NONE DETECTED   Benzodiazepines NONE DETECTED NONE DETECTED   Amphetamines NONE DETECTED NONE DETECTED   Tetrahydrocannabinol POSITIVE (A) NONE DETECTED   Barbiturates NONE DETECTED NONE DETECTED    Comment:        DRUG SCREEN FOR MEDICAL PURPOSES ONLY.  IF CONFIRMATION IS NEEDED FOR ANY PURPOSE, NOTIFY LAB WITHIN 5 DAYS.        LOWEST DETECTABLE LIMITS FOR URINE DRUG SCREEN Drug Class       Cutoff (ng/mL) Amphetamine      1000 Barbiturate      200 Benzodiazepine    607 Tricyclics       371 Opiates          300 Cocaine          300 THC              50   Urinalysis, Routine w reflex microscopic (not at [facility])     Status: None   Collection Time: 11/25/15  7:30 AM  Result Value Ref Range   Color, Urine YELLOW YELLOW   APPearance CLEAR CLEAR   Specific Gravity, Urine 1.007 1.005 - 1.030   pH 6.5 5.0 - 8.0   Glucose, UA NEGATIVE NEGATIVE mg/dL   Hgb urine dipstick NEGATIVE NEGATIVE   Bilirubin Urine NEGATIVE NEGATIVE   Ketones, ur NEGATIVE NEGATIVE mg/dL   Protein, ur NEGATIVE NEGATIVE mg/dL   Nitrite NEGATIVE NEGATIVE   Leukocytes, UA NEGATIVE NEGATIVE    Comment: MICROSCOPIC NOT DONE ON URINES WITH NEGATIVE PROTEIN, BLOOD, LEUKOCYTES, NITRITE, OR GLUCOSE <1000 mg/dL.  Comprehensive metabolic panel     Status: Abnormal   Collection Time: 11/25/15  7:30 AM  Result Value Ref Range   Sodium 139 135 - 145 mmol/L  Potassium 4.0 3.5 - 5.1 mmol/L   Chloride 102 101 - 111 mmol/L   CO2 28 22 - 32 mmol/L   Glucose, Bld 80 65 - 99 mg/dL   BUN 6 6 - 20 mg/dL   Creatinine, Ser 0.83 0.61 - 1.24 mg/dL   Calcium 9.9 8.9 - 10.3 mg/dL   Total Protein 7.6 6.5 - 8.1 g/dL   Albumin 4.4 3.5 - 5.0 g/dL   AST 47 (H) 15 - 41 U/L   ALT 74 (H) 17 - 63 U/L   Alkaline Phosphatase 67 38 - 126 U/L   Total Bilirubin 0.9 0.3 - 1.2 mg/dL   GFR calc non Af Amer >60 >60 mL/min   GFR calc Af Amer >60 >60 mL/min    Comment: (NOTE) The eGFR has been calculated using the CKD EPI equation. This calculation has not been validated in all clinical situations. eGFR's persistently <60 mL/min signify possible Chronic Kidney Disease.    Anion gap 9 5 - 15  CBC with Differential/Platelet     Status: Abnormal   Collection Time: 11/25/15  7:30 AM  Result Value Ref Range   WBC 11.5 (H) 4.0 - 10.5 K/uL   RBC 4.09 (L) 4.22 - 5.81 MIL/uL   Hemoglobin 13.2 13.0 - 17.0 g/dL   HCT 40.0 39.0 - 52.0 %   MCV 97.8 78.0 - 100.0 fL   MCH 32.3 26.0 - 34.0 pg   MCHC 33.0 30.0 - 36.0 g/dL    RDW 13.3 11.5 - 15.5 %   Platelets 269 150 - 400 K/uL   Neutrophils Relative % 76 %   Neutro Abs 8.8 (H) 1.7 - 7.7 K/uL   Lymphocytes Relative 12 %   Lymphs Abs 1.4 0.7 - 4.0 K/uL   Monocytes Relative 11 %   Monocytes Absolute 1.2 (H) 0.1 - 1.0 K/uL   Eosinophils Relative 1 %   Eosinophils Absolute 0.1 0.0 - 0.7 K/uL   Basophils Relative 0 %   Basophils Absolute 0.0 0.0 - 0.1 K/uL  Ethanol     Status: None   Collection Time: 11/25/15  7:31 AM  Result Value Ref Range   Alcohol, Ethyl (B) <5 <5 mg/dL    Comment:        LOWEST DETECTABLE LIMIT FOR SERUM ALCOHOL IS 5 mg/dL FOR MEDICAL PURPOSES ONLY   I-Stat CG4 Lactic Acid, ED     Status: None   Collection Time: 11/25/15  7:40 AM  Result Value Ref Range   Lactic Acid, Venous 1.53 0.5 - 2.0 mmol/L  CK     Status: None   Collection Time: 11/25/15 11:20 AM  Result Value Ref Range   Total CK 255 49 - 397 U/L  Culture, blood (Routine X 2) w Reflex to ID Panel     Status: None (Preliminary result)   Collection Time: 11/25/15 11:30 AM  Result Value Ref Range   Specimen Description BLOOD RIGHT ANTECUBITAL    Special Requests      BOTTLES DRAWN AEROBIC AND ANAEROBIC 10CC Performed at [facility]    Culture PENDING    Report Status PENDING   Culture, blood (Routine X 2) w Reflex to ID Panel     Status: None (Preliminary result)   Collection Time: 11/25/15 11:30 AM  Result Value Ref Range   Specimen Description BLOOD RIGHT ANTECUBITAL    Special Requests      IN PEDIATRIC BOTTLE 2.5CC Performed at [facility]    Culture PENDING  Report Status PENDING   Iron     Status: None   Collection Time: 11/25/15 11:30 AM  Result Value Ref Range   Iron 46 45 - 182 ug/dL    Comment: Performed at [facility]  I-Stat CG4 Lactic Acid, ED     Status: Abnormal   Collection Time: 11/25/15 12:30 PM  Result Value Ref Range   Lactic Acid, Venous 2.59 (HH) 0.5 - 2.0 mmol/L   Comment NOTIFIED PHYSICIAN   Comprehensive  metabolic panel     Status: Abnormal   Collection Time: 11/25/15  2:25 PM  Result Value Ref Range   Sodium 141 135 - 145 mmol/L   Potassium 3.8 3.5 - 5.1 mmol/L   Chloride 102 101 - 111 mmol/L   CO2 24 22 - 32 mmol/L   Glucose, Bld 100 (H) 65 - 99 mg/dL   BUN <5 (L) 6 - 20 mg/dL   Creatinine, Ser 0.87 0.61 - 1.24 mg/dL   Calcium 9.6 8.9 - 10.3 mg/dL   Total Protein 6.8 6.5 - 8.1 g/dL   Albumin 3.7 3.5 - 5.0 g/dL   AST 43 (H) 15 - 41 U/L   ALT 69 (H) 17 - 63 U/L   Alkaline Phosphatase 61 38 - 126 U/L   Total Bilirubin 0.7 0.3 - 1.2 mg/dL   GFR calc non Af Amer >60 >60 mL/min   GFR calc Af Amer >60 >60 mL/min    Comment: (NOTE) The eGFR has been calculated using the CKD EPI equation. This calculation has not been validated in all clinical situations. eGFR's persistently <60 mL/min signify possible Chronic Kidney Disease.    Anion gap 15 5 - 15  CBC with Differential     Status: Abnormal   Collection Time: 11/25/15  2:25 PM  Result Value Ref Range   WBC 8.8 4.0 - 10.5 K/uL   RBC 4.12 (L) 4.22 - 5.81 MIL/uL   Hemoglobin 13.2 13.0 - 17.0 g/dL   HCT 40.2 39.0 - 52.0 %   MCV 97.6 78.0 - 100.0 fL   MCH 32.0 26.0 - 34.0 pg   MCHC 32.8 30.0 - 36.0 g/dL   RDW 13.3 11.5 - 15.5 %   Platelets 239 150 - 400 K/uL   Neutrophils Relative % 64 %   Neutro Abs 5.7 1.7 - 7.7 K/uL   Lymphocytes Relative 18 %   Lymphs Abs 1.5 0.7 - 4.0 K/uL   Monocytes Relative 17 %   Monocytes Absolute 1.5 (H) 0.1 - 1.0 K/uL   Eosinophils Relative 1 %   Eosinophils Absolute 0.1 0.0 - 0.7 K/uL   Basophils Relative 0 %   Basophils Absolute 0.0 0.0 - 0.1 K/uL  APTT     Status: None   Collection Time: 11/25/15  2:25 PM  Result Value Ref Range   aPTT 34 24 - 37 seconds  Protime-INR     Status: None   Collection Time: 11/25/15  2:25 PM  Result Value Ref Range   Prothrombin Time 14.0 11.6 - 15.2 seconds   INR 1.06 0.00 - 1.49  Procalcitonin     Status: None   Collection Time: 11/25/15  2:34 PM  Result  Value Ref Range   Procalcitonin <0.10 ng/mL    Comment:        Interpretation: PCT (Procalcitonin) <= 0.5 ng/mL: Systemic infection (sepsis) is not likely. Local bacterial infection is possible. (NOTE)         ICU PCT Algorithm  Non ICU PCT Algorithm    ----------------------------     ------------------------------         PCT < 0.25 ng/mL                 PCT < 0.1 ng/mL     Stopping of antibiotics            Stopping of antibiotics       strongly encouraged.               strongly encouraged.    ----------------------------     ------------------------------       PCT level decrease by               PCT < 0.25 ng/mL       >= 80% from peak PCT       OR PCT 0.25 - 0.5 ng/mL          Stopping of antibiotics                                             encouraged.     Stopping of antibiotics           encouraged.    ----------------------------     ------------------------------       PCT level decrease by              PCT >= 0.25 ng/mL       < 80% from peak PCT        AND PCT >= 0.5 ng/mL            Continuin g antibiotics                                              encouraged.       Continuing antibiotics            encouraged.    ----------------------------     ------------------------------     PCT level increase compared          PCT > 0.5 ng/mL         with peak PCT AND          PCT >= 0.5 ng/mL             Escalation of antibiotics                                          strongly encouraged.      Escalation of antibiotics        strongly encouraged.   Lactic acid, plasma     Status: None   Collection Time: 11/25/15  2:34 PM  Result Value Ref Range   Lactic Acid, Venous 1.2 0.5 - 2.0 mmol/L  I-Stat CG4 Lactic Acid, ED     Status: None   Collection Time: 11/25/15  2:50 PM  Result Value Ref Range   Lactic Acid, Venous 1.04 0.5 - 2.0 mmol/L  CSF cell count with differential collection tube #: 1     Status: Abnormal   Collection Time: 11/25/15  3:15 PM   Result Value Ref Range   Tube # 1  Color, CSF COLORLESS COLORLESS   Appearance, CSF CLEAR (A) CLEAR   Supernatant NOT INDICATED    RBC Count, CSF 39 (H) 0 /cu mm   WBC, CSF 1 0 - 5 /cu mm   Other Cells, CSF TOO FEW TO COUNT, SMEAR AVAILABLE FOR REVIEW     Comment: RARE LYMPHOCYTE PRESENT  CSF cell count with differential collection tube #: 4     Status: Abnormal   Collection Time: 11/25/15  3:15 PM  Result Value Ref Range   Tube # 4    Color, CSF PINK (A) COLORLESS   Appearance, CSF HAZY (A) CLEAR   Supernatant PINK    RBC Count, CSF 1725 (H) 0 /cu mm   WBC, CSF 0 0 - 5 /cu mm   Other Cells, CSF TOO FEW TO COUNT, SMEAR AVAILABLE FOR REVIEW     Comment: RARE LYMPHOCYTE, RARE MONOCYTE, RARE NEUTROPHIL PRESENT  Protein and glucose, CSF     Status: Abnormal   Collection Time: 11/25/15  3:15 PM  Result Value Ref Range   Glucose, CSF 72 (H) 40 - 70 mg/dL   Total  Protein, CSF 36 15 - 45 mg/dL  Glucose, capillary     Status: None   Collection Time: 11/25/15  3:45 PM  Result Value Ref Range   Glucose-Capillary 90 65 - 99 mg/dL  CSF culture     Status: None (Preliminary result)   Collection Time: 11/25/15  4:15 PM  Result Value Ref Range   Specimen Description CSF    Special Requests NONE    Gram Stain      CYTOSPIN SMEAR WBC PRESENT, PREDOMINANTLY PMN NO ORGANISMS SEEN CONFIRMED BY V WILLIAMS    Culture NO GROWTH < 24 HOURS    Report Status PENDING   Glucose, capillary     Status: Abnormal   Collection Time: 11/25/15  8:56 PM  Result Value Ref Range   Glucose-Capillary 121 (H) 65 - 99 mg/dL  MRSA PCR Screening     Status: None   Collection Time: 11/25/15 10:51 PM  Result Value Ref Range   MRSA by PCR NEGATIVE NEGATIVE    Comment:        The GeneXpert MRSA Assay (FDA approved for NASAL specimens only), is one component of a comprehensive MRSA colonization surveillance program. It is not intended to diagnose MRSA infection nor to guide or monitor treatment  for MRSA infections.   Glucose, capillary     Status: Abnormal   Collection Time: 11/26/15 12:12 AM  Result Value Ref Range   Glucose-Capillary 102 (H) 65 - 99 mg/dL  Urinalysis, Routine w reflex microscopic (not at [facility])     Status: None   Collection Time: 11/26/15  1:27 AM  Result Value Ref Range   Color, Urine YELLOW YELLOW   APPearance CLEAR CLEAR   Specific Gravity, Urine 1.012 1.005 - 1.030   pH 7.5 5.0 - 8.0   Glucose, UA NEGATIVE NEGATIVE mg/dL   Hgb urine dipstick NEGATIVE NEGATIVE   Bilirubin Urine NEGATIVE NEGATIVE   Ketones, ur NEGATIVE NEGATIVE mg/dL   Protein, ur NEGATIVE NEGATIVE mg/dL   Nitrite NEGATIVE NEGATIVE   Leukocytes, UA NEGATIVE NEGATIVE    Comment: MICROSCOPIC NOT DONE ON URINES WITH NEGATIVE PROTEIN, BLOOD, LEUKOCYTES, NITRITE, OR GLUCOSE <1000 mg/dL.  CBC     Status: Abnormal   Collection Time: 11/26/15  4:15 AM  Result Value Ref Range   WBC 8.2 4.0 - 10.5 K/uL   RBC 4.10 (L) 4.22 -  5.81 MIL/uL   Hemoglobin 13.1 13.0 - 17.0 g/dL   HCT 39.6 39.0 - 52.0 %   MCV 96.6 78.0 - 100.0 fL   MCH 32.0 26.0 - 34.0 pg   MCHC 33.1 30.0 - 36.0 g/dL   RDW 13.1 11.5 - 15.5 %   Platelets 236 150 - 400 K/uL  Basic metabolic panel     Status: Abnormal   Collection Time: 11/26/15  4:15 AM  Result Value Ref Range   Sodium 138 135 - 145 mmol/L   Potassium 3.7 3.5 - 5.1 mmol/L   Chloride 101 101 - 111 mmol/L   CO2 24 22 - 32 mmol/L   Glucose, Bld 123 (H) 65 - 99 mg/dL   BUN 6 6 - 20 mg/dL   Creatinine, Ser 0.93 0.61 - 1.24 mg/dL   Calcium 9.0 8.9 - 10.3 mg/dL   GFR calc non Af Amer >60 >60 mL/min   GFR calc Af Amer >60 >60 mL/min    Comment: (NOTE) The eGFR has been calculated using the CKD EPI equation. This calculation has not been validated in all clinical situations. eGFR's persistently <60 mL/min signify possible Chronic Kidney Disease.    Anion gap 13 5 - 15  Magnesium     Status: None   Collection Time: 11/26/15  4:15 AM  Result Value Ref Range    Magnesium 1.8 1.7 - 2.4 mg/dL  Phosphorus     Status: None   Collection Time: 11/26/15  4:15 AM  Result Value Ref Range   Phosphorus 3.9 2.5 - 4.6 mg/dL  CK     Status: Abnormal   Collection Time: 11/26/15  4:15 AM  Result Value Ref Range   Total CK 967 (H) 49 - 397 U/L  Blood gas, arterial     Status: Abnormal   Collection Time: 11/26/15  4:22 AM  Result Value Ref Range   FIO2 0.21    Delivery systems ROOM AIR    pH, Arterial 7.445 7.350 - 7.450   pCO2 arterial 35.8 35.0 - 45.0 mmHg   pO2, Arterial 82.9 80.0 - 100.0 mmHg   Bicarbonate 24.2 (H) 20.0 - 24.0 mEq/L   TCO2 25.3 0 - 100 mmol/L   Acid-Base Excess 0.5 0.0 - 2.0 mmol/L   O2 Saturation 96.3 %   Patient temperature 98.6    Collection site RIGHT RADIAL    Drawn by [id number]    Sample type ARTERIAL DRAW    Allens test (pass/fail) PASS PASS  Glucose, capillary     Status: None   Collection Time: 11/26/15  8:14 AM  Result Value Ref Range   Glucose-Capillary 77 65 - 99 mg/dL   Comment 1 Notify RN    Comment 2 Document in Chart     Current Facility-Administered Medications  Medication Dose Route Frequency Provider Last Rate Last Dose  . 0.9 %  sodium chloride infusion   Intravenous Continuous Patrecia Pour, NP 200 mL/hr at 11/25/15 1900    . 0.9 %  sodium chloride infusion  250 mL Intravenous PRN Raylene Miyamoto, MD   Stopped at 11/26/15 [phone]  . acetaminophen (TYLENOL) tablet 650 mg  650 mg Oral Q4H PRN Raylene Miyamoto, MD      . acyclovir (ZOVIRAX) 680 mg in dextrose 5 % 100 mL IVPB  10 mg/kg Intravenous Q8H Lauren D Bajbus, RPH   680 mg at 11/26/15 0514  . cefTRIAXone (ROCEPHIN) 2 g in dextrose 5 % 50 mL IVPB  2 g Intravenous  Q12H Lauren D Bajbus, RPH   2 g at 11/26/15 0834  . diphenhydrAMINE (BENADRYL) injection 50 mg  50 mg Intramuscular Q6H Raylene Miyamoto, MD   50 mg at 11/26/15 0834  . famotidine (PEPCID) IVPB 20 mg premix  20 mg Intravenous Q12H Raylene Miyamoto, MD   20 mg at 11/26/15 0954  . heparin  injection 5,000 Units  5,000 Units Subcutaneous 3 times per day Raylene Miyamoto, MD   5,000 Units at 11/26/15 [phone]  . LORazepam (ATIVAN) injection 1 mg  1 mg Intravenous Q6H PRN Raylene Miyamoto, MD   1 mg at 11/26/15 0954  . LORazepam (ATIVAN) injection 2 mg  2 mg Intravenous Once Deno Etienne, DO      . nicotine (NICODERM CQ - dosed in mg/24 hours) patch 21 mg  21 mg Transdermal Daily Leonard Schwartz, MD   21 mg at 11/25/15 0925  . ondansetron (ZOFRAN) tablet 4 mg  4 mg Oral Q8H PRN Leonard Schwartz, MD      . vancomycin (VANCOCIN) IVPB 1000 mg/200 mL premix  1,000 mg Intravenous Q8H Wynell Balloon, RPH   1,000 mg at 11/26/15 3818    Musculoskeletal: Strength & Muscle Tone: stiff muscles Gait & Station: patient did not stand, not stable Patient leans: N/A  Psychiatric Specialty Exam: Review of Systems  Constitutional: Positive for fever and diaphoresis.  HENT: Negative.   Eyes: Negative.   Respiratory: Negative.   Cardiovascular: Negative.   Gastrointestinal: Negative.   Genitourinary: Negative.   Musculoskeletal: Positive for myalgias.  Skin: Negative.   Neurological: Positive for sensory change and seizures.  Endo/Heme/Allergies: Negative.   Psychiatric/Behavioral: Positive for substance abuse.    Blood pressure 119/75, pulse 77, temperature 98.2 F (36.8 C), temperature source Oral, resp. rate 18, height '5\' 11"'$  (1.803 m), weight 68.629 kg (151 lb 4.8 oz), SpO2 100 %.Body mass index is 21.11 kg/(m^2).  General Appearance: Casual  Eye Contact::  Minimal  Speech:  Clear and Coherent  Volume:  Decreased  Mood:  Anxious  Affect:  Constricted and Depressed  Thought Process:  Coherent and Goal Directed  Orientation:  Full (Time, Place, and Person)  Thought Content:  Rumination  Suicidal Thoughts:  No  Homicidal Thoughts:  No  Memory:  intact  Judgement:  Impaired  Insight:  Fair  Psychomotor Activity:  Restlessness  Concentration:  Fair  Recall:  Good  Fund of  Knowledge:Fair  Language: Good  Akathisia:  Yes  Handed:  Right  AIMS (if indicated):     Assets:  Housing Leisure Time Resilience Social Support  ADL's:  Intact  Cognition: Impaired,  Severe  Sleep:      Treatment Plan Summary: Daily contact with patient to assess and evaluate symptoms and progress in treatment, Medication management and Plan neuroleptic malignant syndrome:   No safety sitter as he has no active suicide/Homicide ideations Patient should be monitored while recovering from neuroleptic malignant syndrome and introducing atypical antipsychotics to prevent further neuroleptic malignant syndrome and a disabled and controlling his schizoaffective disorder including grandiosity, mood swings, agitation and risk taking behaviors.  Start Abilify 5 mg PO BID schizoaffective, safe for this patient which does not cause NMS Start Cogentin 0.5 mg PO BID for EPS Start Ativan 1 mg PO Q6H  PRN Start Benadryl 50 mg PO Q6H for agitation for muscle rigidity and difficulty swallowing Discontinued Prolixin and Haldol due to NMS  Disposition: Refer to ACT services and [facility] for medication management when  medically stable Patient does not meet criteria for psychiatric inpatient admission. Supportive therapy provided about ongoing stressors.  Durward Parcel., MD 11/26/2015 10:45 AM

## 2015-11-26 NOTE — Progress Notes (Signed)
Subjective: Improving   Exam: Filed Vitals:   11/26/15 0500 11/26/15 0800  BP: 119/75   Pulse:    Temp: 98.5 F (36.9 C) 98.2 F (36.8 C)  Resp: 18     HEENT-  Normocephalic, no lesions, without obvious abnormality.  Normal external eye and conjunctiva.  Normal TM's bilaterally.  Normal auditory canals and external ears. Normal external nose, mucus membranes and septum.  Normal pharynx. Cardiovascular- S1, S2 normal, pulses palpable throughout   Lungs- chest clear, no wheezing, rales, normal symmetric air entry Abdomen- normal findings: bowel sounds normal Extremities- no edema Lymph-no adenopathy palpable Musculoskeletal-no joint tenderness, deformity or swelling Skin-warm and dry, no hyperpigmentation, vitiligo, or suspicious lesions    Gen: In bed, NAD MS: alert and orients CN: PERRLA, EOMI, TML, Face equal, sensation grossly intact.  Motor: MAEW Sensory: intact  Pertinent Labs: CK 967 WBC 8.2    Felicie Morn PA-C [facility] [phone]  Impression: 30 YO male admitted due to AMS, fever and rigidity. Prolixin has been stopped and patient improving. CK still elevated but clinical picture improved.    Recommendations: 1) Supportive care     11/26/2015, 9:35 AM

## 2015-11-26 NOTE — Care Management Note (Signed)
Case Management Note  Patient Details  Name: JACQUESE HACKMAN MRN: [id number] Date of Birth: Sep 11, 1986  Subjective/Objective:  Pt admitted on 11/25/15 with neuroleptic malignant syndrome.  PTA, pt independent of ADLS.                  Action/Plan: Will follow for discharge planning as pt progresses.  Per report, pt has spouse and mother.    Expected Discharge Date:                  Expected Discharge Plan:  Home/Self Care  In-House Referral:     Discharge planning Services  CM Consult  Post Acute Care Choice:    Choice offered to:     DME Arranged:    DME Agency:     HH Arranged:    HH Agency:     Status of Service:  In process, will continue to follow  Medicare Important Message Given:    Date Medicare IM Given:    Medicare IM give by:    Date Additional Medicare IM Given:    Additional Medicare Important Message give by:     If discussed at Long Length of Stay Meetings, dates discussed:    Additional Comments:  Quintella Baton, RN, BSN  Trauma/Neuro ICU Case Manager [phone]

## 2015-11-26 NOTE — Consult Note (Signed)
PULMONARY / CRITICAL CARE MEDICINE   Name: Rodney Chandler MRN: [id number] DOB: 1985-11-06    ADMISSION DATE:  11/25/2015 CONSULTATION DATE:  2/12  REFERRING MD:  edp  CHIEF COMPLAINT:  AMS  BRIEF  30 yo bipolar schizophrenic recently started on Prolixin and was seen in [facility] ED 2/11 for suspected tardive dyskinesia and treated with ativan and he walked out of ER. Represents 2/12 with decreased LOC, dull affect, increased drooling, mild nuchal rigidity. He was transferred to [facility] because of needed Neuro input. He will be admitted to ICU, CT of head to be performed, possible LP to rule neuro infection. He may need treatment for NMS in near future.    STUDIES:  2/12 CT head>>  CULTURES: 2/12 bcx 2>> 2/12 uc>> 2/12 csf>>  ANTIBIOTICS: 2/12 vanc>> 2/12 roc>>  SIGNIFICANT EVENTS: 11/25/15- LP - 0 WBC   SUBJECTIVE/OVERNIGHT/INTERVAL HX 11/26/15 - wants to eat. Wnts to go home. AFebfrile,.. Feels well. On cell phone. Threatening AMA - says he is on new trazadone given by [place] and so does not want to see psych anymopre. Howver, trazadone not on MAR here (is a home med)   VITAL SIGNS: BP 119/75 mmHg  Pulse 77  Temp(Src) 98.2 F (36.8 C) (Oral)  Resp 18  Ht  (1.803 m)  Wt 68.629 kg (151 lb 4.8 oz)  BMI 21.11 kg/m2  SpO2 100%  HEMODYNAMICS:    VENTILATOR SETTINGS:    INTAKE / OUTPUT: I/O last 3 completed shifts: In: 4150 [P.O.:480; I.V.:3670] Out: 2475 [Urine:2475]  PHYSICAL EXAMINATION: General:  WNWDAAM. Neuro: GCS 15. CAM-ICu neg for delirium. Talking pn phone . Looks normal. Moves all 4s. No clonus or rigidity HEENT: PERL 4 mm, mild neck stiffnessDull affect, stunted response to command, Drooling, blunted response  Cardiovascular:  HSR RRR Lungs: CTA Abdomen: Soft +bs Musculoskeletal:  intact Skin:  Warm and dry     DISCUSSION: 30 yo bipoar with ? Reaction to prolixin    LABS:  BMET  Recent Labs Lab 11/25/15 0730 11/25/15 1425 11/26/15 0415   NA 139 141 138  K 4.0 3.8 3.7  CL 102 102 101  CO2 BUN 6 <5* 6  CREATININE 0.83 0.87 0.93  GLUCOSE 80 100* 123*    Electrolytes  Recent Labs Lab 11/25/15 0730 11/25/15 1425 11/26/15 0415  CALCIUM 9.9 9.6 9.0  MG  --   --  1.8  PHOS  --   --  3.9    CBC  Recent Labs Lab 11/25/15 0730 11/25/15 1425 11/26/15 0415  WBC 11.5* 8.8 8.2  HGB 13.2 13.2 13.1  HCT 40.0 40.2 39.6  PLT 269 239 236    Coag's  Recent Labs Lab 11/25/15 1425  APTT 34  INR 1.06    Sepsis Markers  Recent Labs Lab 11/25/15 1230 11/25/15 1434 11/25/15 1450  LATICACIDVEN 2.59* 1.2 1.04  PROCALCITON  --  <0.10  --     ABG  Recent Labs Lab 11/26/15 0422  PHART 7.445  PCO2ART 35.8  PO2ART 82.9    Liver Enzymes  Recent Labs Lab 11/25/15 0730 11/25/15 1425  AST 47* 43*  ALT 74* 69*  ALKPHOS 67 61  BILITOT 0.9 0.7  ALBUMIN 4.4 3.7    Cardiac Enzymes No results for input(s): TROPONINI, PROBNP in the last 168 hours.  Glucose  Recent Labs Lab 11/25/15 1545 11/25/15 2056 11/26/15 0012 11/26/15 0814  GLUCAP 90 121* 102* 77    Imaging Dg Chest 2 View  11/25/2015  CLINICAL DATA:  30 year old male with acute chest pain following seizure. EXAM: CHEST  2 VIEW COMPARISON:  04/26/2015 chest radiograph FINDINGS: The cardiomediastinal silhouette is unremarkable. There is no evidence of focal airspace disease, pulmonary edema, suspicious pulmonary nodule/mass, pleural effusion, or pneumothorax. No acute bony abnormalities are identified. IMPRESSION: No active cardiopulmonary disease. Electronically Signed   By: Harmon Pier M.D.   On: 11/25/2015 11:25   Ct Head Wo Contrast  11/25/2015  CLINICAL DATA:  Seizure this morning with altered mental status EXAM: CT HEAD WITHOUT CONTRAST TECHNIQUE: Contiguous axial images were obtained from the base of the skull through the vertex without intravenous contrast. COMPARISON:  September 17, 2013 FINDINGS: The ventricles are normal in  size and configuration. There is no intracranial mass, hemorrhage, extra-axial fluid collection, or midline shift. The gray-white compartments appear normal. No acute infarct evident. The bony calvarium appears intact. The mastoid air cells are clear. No intraorbital lesions are identified. There is leftward deviation of the nasal septum. There is right nasal turbinate edema. IMPRESSION: Right nasal turbinate edema with narrowing of the right naris. Leftward deviation the nasal septum. No intracranial mass, hemorrhage, or extra-axial fluid collection. Gray-white compartments appear normal. Electronically Signed   By: Bretta Bang III M.D.   On: 11/25/2015 15:41    ASSESSMENT / PLAN: NEUROLOGIC A:   AMS, drooling, ? NMS vs infection Hx of bipolar schizophrenia Recently started on prolixin  per psych  Substance abuse THC   - normal neuro exam 11/26/15 per MD but RN feels myuscles stil spastic. CK at 900s 11/26/15 . LP normal - cultures pending  P:   RASS goal: 0 Continue IV fluids 248ml?h Neuro eval and psych eval for further directive - Dr Lennon Alstrom of psych called    PULMONARY A: No acute issue P:   monitor  CARDIOVASCULAR A:  No acute issue P:  Monitor in ICU  RENAL A:   Mild rhabdo ongoing 11/26/15 Mild hypomag < 2gm% 11/26/15 P:   Normal slaine at 200cc/h Replete 2gm mag sulfare IV   GASTROINTESTINAL A:   Gi protection P:   PPI Regular diet  HEMATOLOGIC A:   No acute issues P:    INFECTIOUS A:   Features more c.w NMS . First PCT negative P:   See ID Repeat PCT and if negative dc abx LP for CSF cultures see ID  ENDOCRINE A:   No acute issue P:   monitor  FAMILY  - Updates: No family at bedside  - Inter-disciplinary family meet or Palliative Care meeting due by:  day 7   GLOBAL 2/13 - move to med surg for [facility]. At risk for AMA. D.w Dr Lennon Alstrom pf pyusch - he will see him. Does not appear to hav eindication for commitment to [facility]     Dr. Kalman Shan, M.D., [facility].C.P Pulmonary and Critical Care Medicine Staff Physician [facility] Pager: [phone], If no answer or between  15:00h - 7:00h: call [phone]  11/26/2015 9:22 AM

## 2015-11-27 LAB — CBC WITH DIFFERENTIAL/PLATELET
Basophils Absolute: 0 10*3/uL (ref 0.0–0.1)
Basophils Relative: 0 %
EOS PCT: 0 %
Eosinophils Absolute: 0 10*3/uL (ref 0.0–0.7)
HCT: 41.1 % (ref 39.0–52.0)
Hemoglobin: 14 g/dL (ref 13.0–17.0)
LYMPHS ABS: 2.2 10*3/uL (ref 0.7–4.0)
LYMPHS PCT: 24 %
MCH: 33.1 pg (ref 26.0–34.0)
MCHC: 34.1 g/dL (ref 30.0–36.0)
MCV: 97.2 fL (ref 78.0–100.0)
MONO ABS: 1.1 10*3/uL — AB (ref 0.1–1.0)
MONOS PCT: 12 %
Neutro Abs: 5.7 10*3/uL (ref 1.7–7.7)
Neutrophils Relative %: 64 %
PLATELETS: 239 10*3/uL (ref 150–400)
RBC: 4.23 MIL/uL (ref 4.22–5.81)
RDW: 13.3 % (ref 11.5–15.5)
WBC: 9.1 10*3/uL (ref 4.0–10.5)

## 2015-11-27 LAB — BASIC METABOLIC PANEL
Anion gap: 12 (ref 5–15)
BUN: 9 mg/dL (ref 6–20)
CHLORIDE: 104 mmol/L (ref 101–111)
CO2: 25 mmol/L (ref 22–32)
Calcium: 9.1 mg/dL (ref 8.9–10.3)
Creatinine, Ser: 0.97 mg/dL (ref 0.61–1.24)
GFR calc Af Amer: >60 mL/min (ref >60)
GFR calc non Af Amer: >60 mL/min (ref >60)
GLUCOSE: 108 mg/dL — AB (ref 65–99)
POTASSIUM: 3.7 mmol/L (ref 3.5–5.1)
Sodium: 141 mmol/L (ref 135–145)

## 2015-11-27 LAB — CK TOTAL AND CKMB (NOT AT ARMC)
CK, MB: 3.4 ng/mL (ref 0.5–5.0)
RELATIVE INDEX: 0.2 (ref 0.0–2.5)
Total CK: 1883 U/L — ABNORMAL HIGH (ref 49–397)

## 2015-11-27 LAB — HERPES SIMPLEX VIRUS(HSV) DNA BY PCR
HSV 1 DNA: NEGATIVE
HSV 2 DNA: NEGATIVE

## 2015-11-27 LAB — GLUCOSE, CAPILLARY: Glucose-Capillary: 73 mg/dL (ref 65–99)

## 2015-11-27 LAB — MAGNESIUM: Magnesium: 1.9 mg/dL (ref 1.7–2.4)

## 2015-11-27 MED ORDER — SODIUM CHLORIDE 0.9 % IV SOLN: INTRAVENOUS | Status: DC

## 2015-11-27 MED ORDER — FAMOTIDINE 20 MG PO TABS
20.0000 mg | ORAL_TABLET | Freq: Two times a day (BID) | ORAL | Status: DC
  Filled 2015-11-27: qty 1

## 2015-11-27 NOTE — Discharge Summary (Signed)
Patient left AMA, he was admitted for neuroleptic malignant syndrome under the care of pulmonary critical care, he was transferred to my care this morning, when I evaluated him this morning his CK levels were elevated, I recommended one more day of hospital stay for hydration, patient was completely alert awake oriented 3 and competent and said he would like to leave AMA and follow outpatient.  I then called psychiatrist on call Dr. Shela Commons who recommended that since patient does not have a good insight of his health he might medically IVC him, this conversation to place at 29 AM, I informed the charge nurse, nurse, secretary and hospital security that patient would be medically IVC by the psych team. However at 11 AM patient left despite extensive counseling, we did not have any IVC orders. Note patient was not a threat to others. He did not have any suicidal or homicidal ideations, he was being kept for adequate treatment. I had discussed this with Dr. Shela Commons 3 times prior to patient leaving.  Again patient has left AMA.

## 2015-11-27 NOTE — Progress Notes (Signed)
Patient agitated and asking to leave AMA. Security and [employer] arrived to floor. Per Dr Thedore Mins, patient was to be IVC'ed. Writer called Dr. Elsie Saas to ask about getting IVC papers. MD stated to call CSW about getting papers in order. Writer called CSW [place]. CSW stated that she was working on the papers and would bring them up for Dr Thedore Mins to sign shortly. [employer] and Security stated that until they could not prevent patient from leaving until they had IVC papers. Patient got on elevator and left unit. Dr Thedore Mins and CSW Almira Coaster both notified.

## 2015-11-27 NOTE — Progress Notes (Signed)
Pt. Had an elevated CK this morning, Dr. Thedore Mins wanted the patient to stay until that issue was resolved, patient decided that he didn't want to stay, Dr. Thedore Mins wanted to have him involuntary commited and made calls to psych to have it done unfortunately the pt. Demanded AMA papers before psych could come see him

## 2015-11-27 NOTE — Progress Notes (Signed)
Pt. Signed out AMA

## 2015-11-28 ENCOUNTER — Inpatient Hospital Stay (HOSPITAL_COMMUNITY): Payer: Medicaid Other

## 2015-11-28 ENCOUNTER — Encounter (HOSPITAL_COMMUNITY): Payer: Self-pay | Admitting: Emergency Medicine

## 2015-11-28 ENCOUNTER — Inpatient Hospital Stay (HOSPITAL_COMMUNITY)
Admission: EM | Admit: 2015-11-28 | Discharge: 2015-12-01 | DRG: 092 | Disposition: A | Discharge status: Home / Self Care | Payer: Medicaid Other | Attending: Internal Medicine | Admitting: Internal Medicine

## 2015-11-28 DIAGNOSIS — F1220 Cannabis dependence, uncomplicated: Secondary | ICD-10-CM | POA: Diagnosis not present

## 2015-11-28 DIAGNOSIS — Z79899 Other long term (current) drug therapy: Secondary | ICD-10-CM

## 2015-11-28 DIAGNOSIS — F309 Manic episode, unspecified: Secondary | ICD-10-CM | POA: Diagnosis present

## 2015-11-28 DIAGNOSIS — G249 Dystonia, unspecified: Secondary | ICD-10-CM | POA: Diagnosis present

## 2015-11-28 DIAGNOSIS — M6282 Rhabdomyolysis: Secondary | ICD-10-CM | POA: Diagnosis present

## 2015-11-28 DIAGNOSIS — F259 Schizoaffective disorder, unspecified: Secondary | ICD-10-CM | POA: Diagnosis present

## 2015-11-28 DIAGNOSIS — Z888 Allergy status to other drugs, medicaments and biological substances status: Secondary | ICD-10-CM | POA: Diagnosis not present

## 2015-11-28 DIAGNOSIS — R451 Restlessness and agitation: Secondary | ICD-10-CM | POA: Diagnosis present

## 2015-11-28 DIAGNOSIS — G210 Malignant neuroleptic syndrome: Secondary | ICD-10-CM | POA: Diagnosis not present

## 2015-11-28 DIAGNOSIS — R748 Abnormal levels of other serum enzymes: Secondary | ICD-10-CM | POA: Insufficient documentation

## 2015-11-28 DIAGNOSIS — Z9119 Patient's noncompliance with other medical treatment and regimen: Secondary | ICD-10-CM

## 2015-11-28 DIAGNOSIS — Z87891 Personal history of nicotine dependence: Secondary | ICD-10-CM | POA: Diagnosis not present

## 2015-11-28 DIAGNOSIS — M549 Dorsalgia, unspecified: Secondary | ICD-10-CM | POA: Diagnosis present

## 2015-11-28 DIAGNOSIS — F12288 Cannabis dependence with other cannabis-induced disorder: Secondary | ICD-10-CM | POA: Diagnosis present

## 2015-11-28 DIAGNOSIS — F312 Bipolar disorder, current episode manic severe with psychotic features: Secondary | ICD-10-CM | POA: Diagnosis present

## 2015-11-28 DIAGNOSIS — Z9114 Patient's other noncompliance with medication regimen: Secondary | ICD-10-CM | POA: Diagnosis not present

## 2015-11-28 DIAGNOSIS — F250 Schizoaffective disorder, bipolar type: Secondary | ICD-10-CM | POA: Diagnosis not present

## 2015-11-28 DIAGNOSIS — Z046 Encounter for general psychiatric examination, requested by authority: Secondary | ICD-10-CM

## 2015-11-28 LAB — CREATININE, SERUM
CREATININE: 0.71 mg/dL (ref 0.61–1.24)
GFR calc non Af Amer: >60 mL/min (ref >60)

## 2015-11-28 LAB — CBC
HCT: 39.2 % (ref 39.0–52.0)
HCT: 39.6 % (ref 39.0–52.0)
Hemoglobin: 13.2 g/dL (ref 13.0–17.0)
Hemoglobin: 13.1 g/dL (ref 13.0–17.0)
MCH: 32.9 pg (ref 26.0–34.0)
MCH: 32.3 pg (ref 26.0–34.0)
MCHC: 33.7 g/dL (ref 30.0–36.0)
MCHC: 33.1 g/dL (ref 30.0–36.0)
MCV: 97.8 fL (ref 78.0–100.0)
MCV: 97.8 fL (ref 78.0–100.0)
PLATELETS: 220 10*3/uL (ref 150–400)
Platelets: 258 10*3/uL (ref 150–400)
RBC: 4.01 MIL/uL — ABNORMAL LOW (ref 4.22–5.81)
RBC: 4.05 MIL/uL — ABNORMAL LOW (ref 4.22–5.81)
RDW: 13 % (ref 11.5–15.5)
RDW: 12.9 % (ref 11.5–15.5)
WBC: 10.7 10*3/uL — ABNORMAL HIGH (ref 4.0–10.5)
WBC: 9.8 10*3/uL (ref 4.0–10.5)

## 2015-11-28 LAB — COMPREHENSIVE METABOLIC PANEL
ALT: 57 U/L (ref 17–63)
ANION GAP: 9 (ref 5–15)
AST: 82 U/L — ABNORMAL HIGH (ref 15–41)
Albumin: 4.3 g/dL (ref 3.5–5.0)
Alkaline Phosphatase: 67 U/L (ref 38–126)
BILIRUBIN TOTAL: 0.6 mg/dL (ref 0.3–1.2)
BUN: 11 mg/dL (ref 6–20)
CO2: 24 mmol/L (ref 22–32)
Calcium: 9.3 mg/dL (ref 8.9–10.3)
Chloride: 107 mmol/L (ref 101–111)
Creatinine, Ser: 0.69 mg/dL (ref 0.61–1.24)
GFR calc Af Amer: >60 mL/min (ref >60)
Glucose, Bld: 109 mg/dL — ABNORMAL HIGH (ref 65–99)
POTASSIUM: 4.1 mmol/L (ref 3.5–5.1)
Sodium: 140 mmol/L (ref 135–145)
TOTAL PROTEIN: 7.6 g/dL (ref 6.5–8.1)

## 2015-11-28 LAB — RAPID URINE DRUG SCREEN, HOSP PERFORMED
Amphetamines: NOT DETECTED
Barbiturates: NOT DETECTED
Benzodiazepines: NOT DETECTED
Cocaine: NOT DETECTED
Opiates: NOT DETECTED
Tetrahydrocannabinol: POSITIVE — AB

## 2015-11-28 LAB — SALICYLATE LEVEL: Salicylate Lvl: <4 mg/dL (ref 2.8–30.0)

## 2015-11-28 LAB — CK
CK TOTAL: 1538 U/L — AB (ref 49–397)
Total CK: 2163 U/L — ABNORMAL HIGH (ref 49–397)

## 2015-11-28 LAB — ETHANOL: Alcohol, Ethyl (B): <5 mg/dL

## 2015-11-28 LAB — ACETAMINOPHEN LEVEL: Acetaminophen (Tylenol), Serum: <10 ug/mL — ABNORMAL LOW (ref 10–30)

## 2015-11-28 MED ORDER — ONDANSETRON HCL 4 MG/2ML IJ SOLN
4.0000 mg | INTRAMUSCULAR | Status: DC | PRN
Start: 2015-11-28 — End: 2015-12-01

## 2015-11-28 MED ORDER — SODIUM CHLORIDE 0.9 % IV SOLN
INTRAVENOUS | Status: DC
  Administered 2015-11-28 – 2015-11-29 (×4): via INTRAVENOUS

## 2015-11-28 MED ORDER — ACETAMINOPHEN 325 MG PO TABS
650.0000 mg | ORAL_TABLET | ORAL | Status: DC | PRN
  Administered 2015-11-29: 650 mg via ORAL
  Filled 2015-11-28: qty 2

## 2015-11-28 MED ORDER — LORAZEPAM 2 MG/ML IJ SOLN
2.0000 mg | Freq: Four times a day (QID) | INTRAMUSCULAR | Status: DC | PRN
  Administered 2015-11-28 – 2015-11-30 (×6): 2 mg via INTRAVENOUS
  Filled 2015-11-28 (×9): qty 1

## 2015-11-28 MED ORDER — SODIUM CHLORIDE 0.9 % IV BOLUS (SEPSIS)
1000.0000 mL | Freq: Once | INTRAVENOUS | Status: AC
  Administered 2015-11-28: 1000 mL via INTRAVENOUS

## 2015-11-28 MED ORDER — AMANTADINE HCL 100 MG PO CAPS
100.0000 mg | ORAL_CAPSULE | Freq: Two times a day (BID) | ORAL | Status: DC
  Administered 2015-11-28 – 2015-12-01 (×5): 100 mg via ORAL
  Filled 2015-11-28 (×8): qty 1

## 2015-11-28 MED ORDER — LORAZEPAM 2 MG/ML IJ SOLN: 1.0000 mg | Freq: Once | INTRAMUSCULAR | Status: DC

## 2015-11-28 MED ORDER — ENOXAPARIN SODIUM 40 MG/0.4ML ~~LOC~~ SOLN
40.0000 mg | SUBCUTANEOUS | Status: DC
  Administered 2015-11-28 – 2015-11-30 (×2): 40 mg via SUBCUTANEOUS
  Filled 2015-11-28 (×3): qty 0.4

## 2015-11-28 MED ORDER — TRAZODONE HCL 100 MG PO TABS
100.0000 mg | ORAL_TABLET | Freq: Every day | ORAL | Status: DC
  Administered 2015-11-28 – 2015-11-29 (×2): 100 mg via ORAL
  Filled 2015-11-28 (×2): qty 1
  Filled 2015-11-28: qty 2
  Filled 2015-11-28: qty 1

## 2015-11-28 MED ORDER — LORAZEPAM 1 MG PO TABS
1.0000 mg | ORAL_TABLET | Freq: Two times a day (BID) | ORAL | Status: DC | PRN
  Administered 2015-11-29 – 2015-11-30 (×2): 1 mg via ORAL
  Filled 2015-11-28 (×2): qty 1

## 2015-11-28 MED ORDER — SENNOSIDES-DOCUSATE SODIUM 8.6-50 MG PO TABS: 1.0000 | ORAL_TABLET | Freq: Every evening | ORAL | Status: DC | PRN

## 2015-11-28 NOTE — ED Notes (Signed)
Patient requesting Trazodone. PA made aware. Waiting for new orders.

## 2015-11-28 NOTE — ED Notes (Addendum)
Patient refusing to keep cardiac monitor or blood pressure cuff on. Patient being verbally aggressive with staff.

## 2015-11-28 NOTE — ED Notes (Signed)
PA at bedside.

## 2015-11-28 NOTE — Progress Notes (Addendum)
Pt c/o a chest cough and states he is coughing up green sputum. Pt was placed on a heart monitor and remains in a SR w/o ectopy./Pt was given  of ativan for severe agitation. Pt is very loud and demanding. Phoned EDP concerning the pts cough. Pt does not appear short of breath at this time. (2:45pm)per MD pt will get a CXR in the dept. Pt has course BS in all lung fields. Pt is for admission. Vital signs remain stable. Left forearm IV 20 intact and patent. 3pm - pt taken to the xray dept via wheelchair- 3;15pm Pt returned from the CXR dept. He does apear to have facial twitching -pt was seen by neurology earlier today. 3:20pm Pt is demanding that he must have his coat to give his uncle 80.00. Tech felt pills in pts coat pocket. Pt was informed that his coat would remain locked up. Report given to [facility] and pt will transported to room 1432. Pt will be transported via monitor. Discussed with the [employer] who stated the coat should remain locked up until the pt is discharged. Pt appeared very angry about this. Receiving nurse made aware pt is a flight risk. [employer] accompanied Milas Hock. -pt was transported on a monitor

## 2015-11-28 NOTE — ED Notes (Signed)
Patient aware that urine sample is needed. States he cannot void at this time.

## 2015-11-28 NOTE — Progress Notes (Signed)
Carryover: 31 year old recently admitted for neuro malignant syndrome believed secondary to antipsychotic medications who had left AMA before realization of recent psychiatric evaluation deeming the patient to be involuntary committed. Patient presents with elevated creatinine kinase>2000. IVC in place at this point.  IV fluids started, vitals stable, and patient to go to a MedSurg bed.

## 2015-11-28 NOTE — ED Provider Notes (Signed)
CSN: [id number]     Arrival date & time 11/28/15  1914 History   First MD Initiated Contact with Patient 11/28/15 [phone]     Chief Complaint  Patient presents with  . Back Pain     (Consider location/radiation/quality/duration/timing/severity/associated sxs/prior Treatment) HPI Comments: The patient arrives to [facility] ED after leaving [facility] as inpatient AMA. The patient does not participate in history except to say that his complaint is back pain and "I had a seizure". History is obtained through the medical record and shows that the patient was admitted to critical care on 11/26/15 for treatment of Neuroleptic Malignant Syndrome secondary to antipsychotic medications. Care was transferred to the hospitalist group on 11/27/15.  The patient was cared for in conjunction with psychiatry (Dr. Elsie Saas) for treatment of schizoaffective disorder and found to have, per his note 11/26/15, grandiosity, agitation, mood swings and risk taking behaviors. The patient wanted to leave the hospital and IVC was recommended due to poor insight with history of schizoaffective disorder making the patient unable to make his own medical decisions. He left [facility] prior to IVC paperwork being implemented and came here.   Patient is a 30 y.o. male presenting with back pain. The history is provided by the patient and medical records. No language interpreter was used.  Back Pain   Past Medical History  Diagnosis Date  . Bipolar 1 disorder (HCC)   . Manic depression (HCC)   . Schizophrenia (HCC)   . Mandibular fracture (HCC) 09/17/2013   Past Surgical History  Procedure Laterality Date  . Orif mandibular fracture N/A 09/17/2013    Procedure: OPEN REDUCTION INTERNAL FIXATION (ORIF) MANDIBULAR FRACTURE;  Surgeon: Darletta Moll, MD;  Location: [facility] OR;  Service: ENT;  Laterality: N/A;  . Mouth surgery    . Mandibular hardware removal N/A 10/18/2013    Procedure: MANDIBULAR HARDWARE REMOVAL;  Surgeon: Darletta Moll, MD;   Location: [facility];  Service: ENT;  Laterality: N/A;   History reviewed. No pertinent family history. Social History  Substance Use Topics  . Smoking status: Former Smoker -- 0.50 packs/day    Types: Cigarettes  . Smokeless tobacco: Never Used  . Alcohol Use: Yes     Comment: Weekends.     Review of Systems  Unable to perform ROS: Psychiatric disorder  Musculoskeletal: Positive for back pain.      Allergies  Prolixin; Lithium; Depakote; and Zyprexa  Home Medications   Prior to Admission medications   Medication Sig Start Date End Date Taking? Authorizing Provider  fluPHENAZine (PROLIXIN) 10 MG tablet Take 10 mg by mouth 2 (two) times daily. Take with  tablet to make    Yes Historical Provider, MD  fluPHENAZine (PROLIXIN) 5 MG tablet Take 5 mg by mouth 2 (two) times daily. Takes with  tablet to make    Yes Historical Provider, MD  traZODone (DESYREL) 100 MG tablet Take 1 tablet (100 mg total) by mouth at bedtime. 09/20/15  Yes Earney Navy, NP  amantadine (SYMMETREL) 100 MG capsule Take 1 capsule (100 mg total) by mouth 2 (two) times daily. Patient not taking: Reported on 10/29/2015 09/20/15   Earney Navy, NP  diphenhydramine-acetaminophen (TYLENOL PM) 25-500 MG TABS tablet Take 2 tablets by mouth daily as needed (for sleep). Reported on 11/25/2015    Historical Provider, MD  haloperidol (HALDOL) 5 MG tablet Take 1 tablet (5 mg total) by mouth 2 (two) times daily. Patient not taking: Reported on 11/24/2015 09/20/15  Earney Navy, NP  LORazepam (ATIVAN) 1 MG tablet Take 1 tablet (1 mg total) by mouth 2 (two) times daily as needed for anxiety. 11/24/15   Richardean Canal, MD  Oxcarbazepine (TRILEPTAL) 300 MG tablet Take 1 tablet (300 mg total) by mouth 2 (two) times daily. Patient not taking: Reported on 10/29/2015 09/20/15   Earney Navy, NP  traZODone (DESYREL) 50 MG tablet Take 50 mg by mouth at bedtime.    Historical Provider, MD    BP 106/59 mmHg  Pulse 80  Temp(Src) 99.5 F (37.5 C) (Oral)  Resp 18  SpO2 100% Physical Exam  Constitutional: He is oriented to person, place, and time. He appears well-developed and well-nourished.  HENT:  Head: Normocephalic.  Neck: Normal range of motion. Neck supple.  Cardiovascular: Normal rate and regular rhythm.   Pulmonary/Chest: Effort normal and breath sounds normal.  Abdominal: Soft. Bowel sounds are normal. There is no tenderness. There is no rebound and no guarding.  Musculoskeletal: Normal range of motion.  He has a dystonic jaw movement. Moves all extremities with full control.   Neurological: He is alert and oriented to person, place, and time.  Skin: Skin is warm and dry. No rash noted.  Psychiatric:  The patient is restless and agitated. He refuses to participate in history or physical.   Nursing note and vitals reviewed.   ED Course  Procedures (including critical care time) Labs Review Labs Reviewed  COMPREHENSIVE METABOLIC PANEL - Abnormal; Notable for the following:    Glucose, Bld 109 (*)    AST 82 (*)    All other components within normal limits  ACETAMINOPHEN LEVEL - Abnormal; Notable for the following:    Acetaminophen (Tylenol), Serum <10 (*)    All other components within normal limits  CBC - Abnormal; Notable for the following:    WBC 10.7 (*)    RBC 4.01 (*)    All other components within normal limits  URINE RAPID DRUG SCREEN, HOSP PERFORMED - Abnormal; Notable for the following:    Tetrahydrocannabinol POSITIVE (*)    All other components within normal limits  CK - Abnormal; Notable for the following:    Total CK 2163 (*)    All other components within normal limits  ETHANOL  SALICYLATE LEVEL    Imaging Review No results found. I have personally reviewed and evaluated these images and lab results as part of my medical decision-making.   EKG Interpretation None      MDM   Final diagnoses:  None    1. Rhabdomyolysis 2.  Dystonia 3. Recent history of Neuroleptic Malignant Syndrome.  4. Schizoaffective disorder  The patient is agitated and restless. He refuses to participate in history or physical exam. Chart reviewed. IVC petition initiated. Discussed the patient with [facility] who accept for admission.   VSS. No fever, hypertension, tachycardia, muscular rigidity - doubt NMS recurrence.      Elpidio Anis, PA-C 11/28/15 4540  Tomasita Crumble, MD 11/28/15 [phone]

## 2015-11-28 NOTE — Progress Notes (Signed)
Entered in d/c instructions  Medicine, [facility]  This is your assigned Medicaid [place] access doctor If you prefer another contact DSS [phone] DSS assigned your doctor *You may receive a bill if you go to any family Dr not assigned to you [address] [place] [address] [phone]   Medicaid [place] Access Covered Patient   [place]: [phone] [address] [place], [place] [address] [email or web address] Use this website to assist with understanding your coverage & to renew application As a Medicaid client you MUST contact DSS/SSI each time you change address, move to another [place] county or another state to keep your address updated  [place] Medicaid Transportation to Dr appts if you are have full Medicaid: [phone], [phone]

## 2015-11-28 NOTE — ED Notes (Signed)
Patient ambulated to stretcher with unsteady gait.

## 2015-11-28 NOTE — H&P (Addendum)
[facility] History and Physical  Rodney Chandler ZOX:[id number] DOB: 1986/08/21 DOA: 11/28/2015  Referring physician:   PCP: [facility]   Chief Complaint: Back pain   HPI:  30 yo bipolar schizophrenic recently started on Prolixin and   seen in [facility] ED 2/11 for suspected tardive dyskinesia and treated with ativan and he walked out of ER. Came back on 2/12 with decreased LOC, dull affect, fever increased drooling, mild nuchal rigidity. In the setting of elevated CK of 255 and white count of 11.5 and a fever of 101.1, the possibility of CNS infection was raised. Patient started on empiric antibiotics. Patient had LP on 11/25/15 which was negative. Neurology saw the patient on 2/12 and recommended  Dantrolene, if the patient continued to deteriorate further. He was transferred to [facility] 2/12 because of need for Neuro input. Subsequently admitted to ICU, CT of head did not show any intracranial mass.  Patient moved to MedSurg unit on 2/13 from where he left AGAINST MEDICAL ADVICE. Patient does not provide much history, noted to have abnormal facial movements which were felt to represent tardive dyskinesia.  Patient's HIV screen was found to be nonreactive, UDS positive for marijuana, CK 2163, pro calcitonin ordered and pending, patient will be transferred to [facility] for neurology evaluation. Patient is currently under IVC    Review of Systems:  Unable to perform ROS: Psychiatric disorder     Past Medical History  Diagnosis Date  . Bipolar 1 disorder (HCC)   . Manic depression (HCC)   . Schizophrenia (HCC)   . Mandibular fracture (HCC) 09/17/2013     Past Surgical History  Procedure Laterality Date  . Orif mandibular fracture N/A 09/17/2013    Procedure: OPEN REDUCTION INTERNAL FIXATION (ORIF) MANDIBULAR FRACTURE;  Surgeon: Darletta Moll, MD;  Location: [facility] OR;  Service: ENT;  Laterality: N/A;  . Mouth surgery    . Mandibular hardware removal N/A 10/18/2013    Procedure:  MANDIBULAR HARDWARE REMOVAL;  Surgeon: Darletta Moll, MD;  Location: [facility];  Service: ENT;  Laterality: N/A;      Social History:  reports that he has quit smoking. His smoking use included Cigarettes. He smoked 0.50 packs per day. He has never used smokeless tobacco. He reports that he drinks alcohol. He reports that he uses illicit drugs (Marijuana) about 14 times per week.   Allergies  Allergen Reactions  . Prolixin [Fluphenazine] Other (See Comments)    Slurred speech, foam at the mouth, spit   . Lithium Other (See Comments)    NOSEBLEED  . Depakote [Divalproex Sodium] Other (See Comments)    NOSEBLEED  . Zyprexa [Olanzapine] Other (See Comments)    NOSEBLEED        FAMILY HISTORY  When questioned  Directly-patient reports  No family history of HTN, CVA ,DIABETES, TB, Cancer CAD, Bleeding Disorders, Sickle Cell, diabetes, anemia, asthma,   Prior to Admission medications   Medication Sig Start Date End Date Taking? Authorizing Provider  fluPHENAZine (PROLIXIN) 10 MG tablet Take 10 mg by mouth 2 (two) times daily. Take with  tablet to make    Yes Historical Provider, MD  fluPHENAZine (PROLIXIN) 5 MG tablet Take 5 mg by mouth 2 (two) times daily. Takes with  tablet to make    Yes Historical Provider, MD  traZODone (DESYREL) 100 MG tablet Take 1 tablet (100 mg total) by mouth at bedtime. 09/20/15  Yes Earney Navy, NP  amantadine (SYMMETREL) 100 MG  capsule Take 1 capsule (100 mg total) by mouth 2 (two) times daily. Patient not taking: Reported on 10/29/2015 09/20/15   Earney Navy, NP  diphenhydramine-acetaminophen (TYLENOL PM) 25-500 MG TABS tablet Take 2 tablets by mouth daily as needed (for sleep). Reported on 11/25/2015    Historical Provider, MD  haloperidol (HALDOL) 5 MG tablet Take 1 tablet (5 mg total) by mouth 2 (two) times daily. Patient not taking: Reported on 11/24/2015 09/20/15   Earney Navy, NP  LORazepam (ATIVAN) 1 MG  tablet Take 1 tablet (1 mg total) by mouth 2 (two) times daily as needed for anxiety. 11/24/15   Richardean Canal, MD  Oxcarbazepine (TRILEPTAL) 300 MG tablet Take 1 tablet (300 mg total) by mouth 2 (two) times daily. Patient not taking: Reported on 10/29/2015 09/20/15   Earney Navy, NP  traZODone (DESYREL) 50 MG tablet Take 50 mg by mouth at bedtime.    Historical Provider, MD     Physical Exam: Filed Vitals:   11/28/15 0239 11/28/15 0616  BP: 132/64 106/59  Pulse: 93 80  Temp: 99.5 F (37.5 C)   TempSrc: Oral   Resp: 18 18  SpO2: 98% 100%     Constitutional: Vital signs reviewed.  Confused with abnormal facial movements Head: Normocephalic and atraumatic  Ear: TM normal bilaterally  Mouth: no erythema or exudates, MMM  Eyes: PERRL, EOMI, conjunctivae normal, No scleral icterus.  Neck: Supple, Trachea midline normal ROM, No JVD, mass, thyromegaly, or carotid bruit present.  Cardiovascular: RRR, S1 normal, S2 normal, no MRG, pulses symmetric and intact bilaterally  Pulmonary/Chest: CTAB, no wheezes, rales, or rhonchi  Abdominal: Soft. Non-tender, non-distended, bowel sounds are normal, no masses, organomegaly, or guarding present.  GU: no CVA tenderness Musculoskeletal: No joint deformities, erythema, or stiffness, ROM full and no nontender Ext: no edema and no cyanosis, pulses palpable bilaterally (DP and PT)  Hematology: no cervical, inginal, or axillary adenopathy.  Neuro: Dull affect, stunted response to command, Drooling, blunted response to pain. No cog wheel rigidity .dystonic jaw movement Skin: Warm, dry and intact. No rash, cyanosis, or clubbing.  Psychiatric:Dull affect .  ognition and memory cannot be tested    Data Review   Micro Results Recent Results (from the past 240 hour(s))  Culture, blood (Routine X 2) w Reflex to ID Panel     Status: None (Preliminary result)   Collection Time: 11/25/15 11:30 AM  Result Value Ref Range Status   Specimen Description  BLOOD RIGHT ANTECUBITAL  Final   Special Requests BOTTLES DRAWN AEROBIC AND ANAEROBIC 10CC  Final   Culture   Final    NO GROWTH 2 DAYS Performed at [facility]    Report Status PENDING  Incomplete  Culture, blood (Routine X 2) w Reflex to ID Panel     Status: None (Preliminary result)   Collection Time: 11/25/15 11:30 AM  Result Value Ref Range Status   Specimen Description BLOOD RIGHT ANTECUBITAL  Final   Special Requests IN PEDIATRIC BOTTLE 2.5CC  Final   Culture   Final    NO GROWTH 2 DAYS Performed at [facility]    Report Status PENDING  Incomplete  Culture, blood (routine x 2)     Status: None (Preliminary result)   Collection Time: 11/25/15  2:25 PM  Result Value Ref Range Status   Specimen Description BLOOD LEFT ANTECUBITAL  Final   Special Requests BOTTLES DRAWN AEROBIC AND ANAEROBIC 5CC  Final   Culture NO  GROWTH 2 DAYS  Final   Report Status PENDING  Incomplete  Culture, blood (routine x 2)     Status: None (Preliminary result)   Collection Time: 11/25/15  2:34 PM  Result Value Ref Range Status   Specimen Description BLOOD LEFT FOREARM  Final   Special Requests BOTTLES DRAWN AEROBIC ONLY 5CC  Final   Culture NO GROWTH 2 DAYS  Final   Report Status PENDING  Incomplete  CSF culture     Status: None (Preliminary result)   Collection Time: 11/25/15  4:15 PM  Result Value Ref Range Status   Specimen Description CSF  Final   Special Requests NONE  Final   Gram Stain   Final    CYTOSPIN SMEAR WBC PRESENT, PREDOMINANTLY PMN NO ORGANISMS SEEN CONFIRMED BY V WILLIAMS    Culture NO GROWTH 2 DAYS  Final   Report Status PENDING  Incomplete  MRSA PCR Screening     Status: None   Collection Time: 11/25/15 10:51 PM  Result Value Ref Range Status   MRSA by PCR NEGATIVE NEGATIVE Final    Comment:        The GeneXpert MRSA Assay (FDA approved for NASAL specimens only), is one component of a comprehensive MRSA colonization surveillance program. It is  not intended to diagnose MRSA infection nor to guide or monitor treatment for MRSA infections.     Radiology Reports Dg Chest 2 View  11/25/2015  CLINICAL DATA:  30 year old male with acute chest pain following seizure. EXAM: CHEST  2 VIEW COMPARISON:  04/26/2015 chest radiograph FINDINGS: The cardiomediastinal silhouette is unremarkable. There is no evidence of focal airspace disease, pulmonary edema, suspicious pulmonary nodule/mass, pleural effusion, or pneumothorax. No acute bony abnormalities are identified. IMPRESSION: No active cardiopulmonary disease. Electronically Signed   By: Harmon Pier M.D.   On: 11/25/2015 11:25   Dg Pelvis 1-2 Views  11/25/2015  CLINICAL DATA:  Pain following seizure EXAM: PELVIS - 1-2 VIEW COMPARISON:  None. FINDINGS: There is no evidence of pelvic fracture or dislocation. Joint spaces appear intact. Sacroiliac joints appear normal bilaterally. IMPRESSION: No fracture or dislocation.  No appreciable arthropathy. Electronically Signed   By: Bretta Bang III M.D.   On: 11/25/2015 07:56   Ct Head Wo Contrast  11/25/2015  CLINICAL DATA:  Seizure this morning with altered mental status EXAM: CT HEAD WITHOUT CONTRAST TECHNIQUE: Contiguous axial images were obtained from the base of the skull through the vertex without intravenous contrast. COMPARISON:  September 17, 2013 FINDINGS: The ventricles are normal in size and configuration. There is no intracranial mass, hemorrhage, extra-axial fluid collection, or midline shift. The gray-white compartments appear normal. No acute infarct evident. The bony calvarium appears intact. The mastoid air cells are clear. No intraorbital lesions are identified. There is leftward deviation of the nasal septum. There is right nasal turbinate edema. IMPRESSION: Right nasal turbinate edema with narrowing of the right naris. Leftward deviation the nasal septum. No intracranial mass, hemorrhage, or extra-axial fluid collection. Gray-white  compartments appear normal. Electronically Signed   By: Bretta Bang III M.D.   On: 11/25/2015 15:41     CBC  Recent Labs Lab 11/25/15 0730 11/25/15 1425 11/26/15 0415 11/27/15 0635 11/28/15 0338  WBC 11.5* 8.8 8.2 9.1 10.7*  HGB 13.2 13.2 13.1 14.0 13.2  HCT 40.0 40.2 39.6 41.1 39.2  PLT 269 239 236 239 258  MCV 97.8 97.6 96.6 97.2 97.8  MCH 32.3 32.0 32.0 33.1 32.9  MCHC 33.0 32.8 33.1  34.1 33.7  RDW 13.3 13.3 13.1 13.3 13.0  LYMPHSABS 1.4 1.5  --  2.2  --   MONOABS 1.2* 1.5*  --  1.1*  --   EOSABS 0.1 0.1  --  0.0  --   BASOSABS 0.0 0.0  --  0.0  --     Chemistries   Recent Labs Lab 11/25/15 0730 11/25/15 1425 11/26/15 0415 11/27/15 0635 11/28/15 0338  NA 139 141 138 141 140  K 4.0 3.8 3.7 3.7 4.1  CL 102 102 101 104 107  CO2 28 24 24 25 24   GLUCOSE 80 100* 123* 108* 109*  BUN 6 <5* 6 9 11   CREATININE 0.83 0.87 0.93 0.97 0.69  CALCIUM 9.9 9.6 9.0 9.1 9.3  MG  --   --  1.8 1.9  --   AST 47* 43*  --   --  82*  ALT 74* 69*  --   --  57  ALKPHOS 67 61  --   --  67  BILITOT 0.9 0.7  --   --  0.6   ------------------------------------------------------------------------------------------------------------------ estimated creatinine clearance is 127.4 mL/min (by C-G formula based on Cr of 0.69). ------------------------------------------------------------------------------------------------------------------ No results for input(s): HGBA1C in the last 72 hours. ------------------------------------------------------------------------------------------------------------------ No results for input(s): CHOL, HDL, LDLCALC, TRIG, CHOLHDL, LDLDIRECT in the last 72 hours. ------------------------------------------------------------------------------------------------------------------ No results for input(s): TSH, T4TOTAL, T3FREE, THYROIDAB in the last 72 hours.  Invalid input(s):  FREET3 ------------------------------------------------------------------------------------------------------------------  Recent Labs  11/25/15 1130  IRON 46    Coagulation profile  Recent Labs Lab 11/25/15 1425  INR 1.06    No results for input(s): DDIMER in the last 72 hours.  Cardiac Enzymes  Recent Labs Lab 11/27/15 0635  CKMB 3.4   ------------------------------------------------------------------------------------------------------------------ Invalid input(s): POCBNP   CBG:  Recent Labs Lab 11/25/15 1545 11/25/15 2056 11/26/15 0012 11/26/15 0350 11/26/15 0814  GLUCAP 90 121* 102* 73 77       EKG: Independently reviewed.    Assessment/Plan Principal Problem: Acute  Rhabdomyolysis-patient admitted for further evaluation Most likely secondary to neuroleptic malignant syndrome due to Prolixin, neuro exam shows tardive dyskinesia, LP normal,LP CSF fluid culture negative  Continue IV fluids Neurology consultation today, may need transfer to [facility] pending neurology evaluation     :   Bipolar affective disorder, current episode manic with psychotic symptoms (HCC) Hold all antipsychotic medications, continue trazodone, Ativan for agitation and sedation Psychiatric consultation requested  , patient currently under IVC and is unable to leave AGAINST MEDICAL ADVICE   Ongoing marijuana use Counseling provided      Code Status Orders        Start     Ordered   11/28/15 0730  Full code   Continuous     11/28/15 0731    Code Status History    Date Active Date Inactive Code Status Order ID Comments User Context     Family Communication: bedside Disposition Plan: Admit to telemetry under IVC  Total time spent 55 minutes.Greater than 50% of this time was spent in counseling, explanation of diagnosis, planning of further management, and coordination of care  [facility] Pager [phone]  If 7PM-7AM, please contact  night-coverage [email or web address] Password [facility] 11/28/2015, 7:41 AM

## 2015-11-28 NOTE — ED Notes (Signed)
Patient resting with eyes closed

## 2015-11-28 NOTE — ED Notes (Signed)
Charge RN made aware that safety sitter needed for patient.

## 2015-11-28 NOTE — Progress Notes (Signed)
Pt refusing to wear cardiac monitor. Pt states " I can't wear it while I'm sleeping." Central telemetry notified. Will continue to monitor.  Lelon Mast, RN

## 2015-11-28 NOTE — Consult Note (Signed)
Admission H&P    Chief Complaint: Altered mental status with tremulousness, low-grade fever and elevated CK.  HPI: Rodney Chandler is an 30 y.o. male with bipolar schizoaffective disorder who was seen at [facility] ED on 211 and subsequently admitted to [facility] with fever, nuchal rigidity and elevated white count. Lumbar puncture was unremarkable. Patient was recently started on Prolixin and was thought to be possibly exhibiting early manifestations of neuroleptic malignant syndrome. His CK at that time was 255. He was treated with antibiotics empirically. Neurology consultation was obtained. Dantrolene sodium was recommended if patient showed further signs of deterioration indicating in neuroleptic malignant syndrome. Patient left the hospital AMA following psychiatric evaluation, but prior to completion of paperwork for IVC. He is now returning with low-grade fever, altered mental status and elevated CK of 2163. Muscle stiffness and tremulousness also noted.  Past Medical History  Diagnosis Date  . Bipolar 1 disorder ([place])   . Manic depression ([place])   . Schizophrenia ([place])   . Mandibular fracture ([place]) 09/17/2013    Past Surgical History  Procedure Laterality Date  . Orif mandibular fracture N/A 09/17/2013    Procedure: OPEN REDUCTION INTERNAL FIXATION (ORIF) MANDIBULAR FRACTURE;  Surgeon: Ascencion Dike, MD;  Location: [place];  Service: ENT;  Laterality: N/A;  . Mouth surgery    . Mandibular hardware removal N/A 10/18/2013    Procedure: MANDIBULAR HARDWARE REMOVAL;  Surgeon: Ascencion Dike, MD;  Location: [place];  Service: ENT;  Laterality: N/A;    History reviewed. No pertinent family history. Social History:  reports that he has quit smoking. His smoking use included Cigarettes. He smoked 0.50 packs per day. He has never used smokeless tobacco. He reports that he drinks alcohol. He reports that he uses illicit drugs (Marijuana) about 14 times per week.  Allergies:   Allergies  Allergen Reactions  . Prolixin [Fluphenazine] Other (See Comments)    Slurred speech, foam at the mouth, spit   . Lithium Other (See Comments)    NOSEBLEED  . Depakote [Divalproex Sodium] Other (See Comments)    NOSEBLEED  . Zyprexa [Olanzapine] Other (See Comments)    NOSEBLEED    Medications: Patient's preadmission medications were reviewed by me.  ROS: Unavailable due to patient's mental status changes.  Physical Examination: Blood pressure 114/61, pulse 79, temperature 99.1 F (37.3 C), temperature source Oral, resp. rate 15, SpO2 100 %.  HEENT-  Normocephalic, no lesions, without obvious abnormality.  Normal external eye and conjunctiva.  Normal TM's bilaterally.  Normal auditory canals and external ears. Normal external nose, mucus membranes and septum.  Normal pharynx. Neck supple with no masses, nodes, nodules or enlargement. Cardiovascular - regular rate and rhythm, S1, S2 normal, no murmur, click, rub or gallop Lungs - chest clear, no wheezing, rales, normal symmetric air entry Abdomen - soft, non-tender; bowel sounds normal; no masses,  no organomegaly Extremities - no joint deformities, effusion, or inflammation and no edema  Neurologic Examination: Mental Status: Alert, disoriented to time, including current month, slightly agitated and very tremulous at times.  Speech fluent without evidence of aphasia. Able to follow commands without difficulty. Cranial Nerves: II-Visual fields were normal. III/IV/VI-Pupils were equal and reacted normally to light. Extraocular movements were full and conjugate.    V/VII-no facial numbness and no facial weakness. VIII-normal. X-normal speech. XI: trapezius strength/neck flexion strength normal bilaterally XII-midline tongue extension with normal strength. Motor: 5/5 strength of upper and lower extremities which was symmetrical; markedly tremulous  while awake with mildly increased muscle tone; flaccid muscle tone with  no tremulousness while sleeping. Sensory: Normal throughout. Deep Tendon Reflexes: 2+, brisk and symmetric. Plantars: Mute bilaterally Cerebellar: Normal finger-to-nose testing except for tremulousness.  Results for orders placed or performed during the hospital encounter of 11/28/15 (from the past 48 hour(s))  Comprehensive metabolic panel     Status: Abnormal   Collection Time: 11/28/15  3:38 AM  Result Value Ref Range   Sodium 140 135 - 145 mmol/L   Potassium 4.1 3.5 - 5.1 mmol/L   Chloride 107 101 - 111 mmol/L   CO2 24 22 - 32 mmol/L   Glucose, Bld 109 (H) 65 - 99 mg/dL   BUN 11 6 - 20 mg/dL   Creatinine, Ser 0.69 0.61 - 1.24 mg/dL   Calcium 9.3 8.9 - 10.3 mg/dL   Total Protein 7.6 6.5 - 8.1 g/dL   Albumin 4.3 3.5 - 5.0 g/dL   AST 82 (H) 15 - 41 U/L   ALT 57 17 - 63 U/L   Alkaline Phosphatase 67 38 - 126 U/L   Total Bilirubin 0.6 0.3 - 1.2 mg/dL   GFR calc non Af Amer >60 >60 mL/min   GFR calc Af Amer >60 >60 mL/min    Comment: (NOTE) The eGFR has been calculated using the CKD EPI equation. This calculation has not been validated in all clinical situations. eGFR's persistently <60 mL/min signify possible Chronic Kidney Disease.    Anion gap 9 5 - 15  Ethanol (ETOH)     Status: None   Collection Time: 11/28/15  3:38 AM  Result Value Ref Range   Alcohol, Ethyl (B) <5 <5 mg/dL    Comment:        LOWEST DETECTABLE LIMIT FOR SERUM ALCOHOL IS 5 mg/dL FOR MEDICAL PURPOSES ONLY   Salicylate level     Status: None   Collection Time: 11/28/15  3:38 AM  Result Value Ref Range   Salicylate Lvl <6.7 2.8 - 30.0 mg/dL  Acetaminophen level     Status: Abnormal   Collection Time: 11/28/15  3:38 AM  Result Value Ref Range   Acetaminophen (Tylenol), Serum <10 (L) 10 - 30 ug/mL    Comment:        THERAPEUTIC CONCENTRATIONS VARY SIGNIFICANTLY. A RANGE OF 10-30 ug/mL MAY BE AN EFFECTIVE CONCENTRATION FOR MANY PATIENTS. HOWEVER, SOME ARE BEST TREATED AT CONCENTRATIONS OUTSIDE  THIS RANGE. ACETAMINOPHEN CONCENTRATIONS >150 ug/mL AT 4 HOURS AFTER INGESTION AND >50 ug/mL AT 12 HOURS AFTER INGESTION ARE OFTEN ASSOCIATED WITH TOXIC REACTIONS.   CBC     Status: Abnormal   Collection Time: 11/28/15  3:38 AM  Result Value Ref Range   WBC 10.7 (H) 4.0 - 10.5 K/uL   RBC 4.01 (L) 4.22 - 5.81 MIL/uL   Hemoglobin 13.2 13.0 - 17.0 g/dL   HCT 39.2 39.0 - 52.0 %   MCV 97.8 78.0 - 100.0 fL   MCH 32.9 26.0 - 34.0 pg   MCHC 33.7 30.0 - 36.0 g/dL   RDW 13.0 11.5 - 15.5 %   Platelets 258 150 - 400 K/uL  CK     Status: Abnormal   Collection Time: 11/28/15  3:38 AM  Result Value Ref Range   Total CK 2163 (H) 49 - 397 U/L  Urine rapid drug screen (hosp performed) (Not at [facility])     Status: Abnormal   Collection Time: 11/28/15  5:16 AM  Result Value Ref Range   Opiates NONE DETECTED  NONE DETECTED   Cocaine NONE DETECTED NONE DETECTED   Benzodiazepines NONE DETECTED NONE DETECTED   Amphetamines NONE DETECTED NONE DETECTED   Tetrahydrocannabinol POSITIVE (A) NONE DETECTED   Barbiturates NONE DETECTED NONE DETECTED    Comment:        DRUG SCREEN FOR MEDICAL PURPOSES ONLY.  IF CONFIRMATION IS NEEDED FOR ANY PURPOSE, NOTIFY LAB WITHIN 5 DAYS.        LOWEST DETECTABLE LIMITS FOR URINE DRUG SCREEN Drug Class       Cutoff (ng/mL) Amphetamine      1000 Barbiturate      200 Benzodiazepine   449 Tricyclics       201 Opiates          300 Cocaine          300 THC              50   CBC     Status: Abnormal   Collection Time: 11/28/15  8:00 AM  Result Value Ref Range   WBC 9.8 4.0 - 10.5 K/uL   RBC 4.05 (L) 4.22 - 5.81 MIL/uL   Hemoglobin 13.1 13.0 - 17.0 g/dL   HCT 39.6 39.0 - 52.0 %   MCV 97.8 78.0 - 100.0 fL   MCH 32.3 26.0 - 34.0 pg   MCHC 33.1 30.0 - 36.0 g/dL   RDW 12.9 11.5 - 15.5 %   Platelets 220 150 - 400 K/uL  Creatinine, serum     Status: None   Collection Time: 11/28/15  8:00 AM  Result Value Ref Range   Creatinine, Ser 0.71 0.61 - 1.24 mg/dL   GFR  calc non Af Amer >60 >60 mL/min   GFR calc Af Amer >60 >60 mL/min    Comment: (NOTE) The eGFR has been calculated using the CKD EPI equation. This calculation has not been validated in all clinical situations. eGFR's persistently <60 mL/min signify possible Chronic Kidney Disease.    No results found.  Assessment/Plan 30 year old man recently given Prolixin for bipolar schizoaffective disorder, presenting with fever, altered mental status and elevated serum CK, most likely early manifestations of neuroleptic malignant syndrome. Patient had lumbar puncture 3 days ago which showed no indication of CNS infection.  Recommendations: 1. Avoid use of Prolixin 2. Stepdown unit admission 3. Liberal hydration for management of elevated CK, as planned, as well as serial CK level checks 4. Will use of dantrolene sodium or bromocriptine for now, as patient has normal muscle tone when relaxed 5. Continue Ativan as needed for management of agitation 6. Management of mental status changes per psychiatry intervention  We will continue to follow this patient with you.  C.R. Nicole Kindred, MD [facility] [phone]  11/28/2015, 10:52 AM

## 2015-11-28 NOTE — ED Notes (Addendum)
Patient presents c/o back pain and possible impending seizure. Upon reading notes it seems that patient left AMA from [facility] after being admitted for seizures and neuroleptic malignant sydnrome. Pt was going to be IVC'd but staff as he has hx of psychiatric disorder and has poor insight into his illness but they were not able to get this done before patient left. Dr. Mora Bellman was made aware of this. Pt is alert at this time.

## 2015-11-29 DIAGNOSIS — F259 Schizoaffective disorder, unspecified: Secondary | ICD-10-CM

## 2015-11-29 DIAGNOSIS — F1220 Cannabis dependence, uncomplicated: Secondary | ICD-10-CM

## 2015-11-29 DIAGNOSIS — G210 Malignant neuroleptic syndrome: Principal | ICD-10-CM

## 2015-11-29 DIAGNOSIS — M6282 Rhabdomyolysis: Secondary | ICD-10-CM | POA: Insufficient documentation

## 2015-11-29 DIAGNOSIS — F312 Bipolar disorder, current episode manic severe with psychotic features: Secondary | ICD-10-CM

## 2015-11-29 DIAGNOSIS — G249 Dystonia, unspecified: Secondary | ICD-10-CM

## 2015-11-29 DIAGNOSIS — F309 Manic episode, unspecified: Secondary | ICD-10-CM

## 2015-11-29 DIAGNOSIS — F250 Schizoaffective disorder, bipolar type: Secondary | ICD-10-CM | POA: Insufficient documentation

## 2015-11-29 LAB — CSF CULTURE: CULTURE: NO GROWTH

## 2015-11-29 LAB — CK: Total CK: 731 U/L — ABNORMAL HIGH (ref 49–397)

## 2015-11-29 MED ORDER — ARIPIPRAZOLE 5 MG PO TABS
5.0000 mg | ORAL_TABLET | Freq: Two times a day (BID) | ORAL | Status: DC
  Administered 2015-11-29 – 2015-11-30 (×3): 5 mg via ORAL
  Filled 2015-11-29 (×5): qty 1

## 2015-11-29 MED ORDER — GUAIFENESIN ER 600 MG PO TB12
1200.0000 mg | ORAL_TABLET | Freq: Two times a day (BID) | ORAL | Status: DC
  Administered 2015-11-29 – 2015-12-01 (×4): 1200 mg via ORAL
  Filled 2015-11-29 (×6): qty 2

## 2015-11-29 MED ORDER — LORAZEPAM 2 MG/ML IJ SOLN
1.0000 mg | Freq: Once | INTRAMUSCULAR | Status: DC
Start: 2015-11-29 — End: 2015-11-29
  Administered 2015-11-29: 1 mg via INTRAVENOUS
  Filled 2015-11-29: qty 1

## 2015-11-29 MED ORDER — BENZTROPINE MESYLATE 0.5 MG PO TABS
0.5000 mg | ORAL_TABLET | Freq: Two times a day (BID) | ORAL | Status: DC | PRN
  Administered 2015-11-29: 0.5 mg via ORAL
  Filled 2015-11-29 (×2): qty 1

## 2015-11-29 MED ORDER — DIPHENHYDRAMINE HCL 25 MG PO CAPS
25.0000 mg | ORAL_CAPSULE | Freq: Four times a day (QID) | ORAL | Status: DC | PRN
  Administered 2015-11-29 (×2): 25 mg via ORAL
  Filled 2015-11-29 (×2): qty 1

## 2015-11-29 MED ORDER — DIPHENHYDRAMINE HCL 50 MG/ML IJ SOLN: 25.0000 mg | Freq: Once | INTRAMUSCULAR | Status: DC

## 2015-11-29 MED ORDER — LORAZEPAM 2 MG/ML IJ SOLN: 1.0000 mg | Freq: Once | INTRAMUSCULAR | Status: DC

## 2015-11-29 NOTE — Progress Notes (Signed)
Patient is not agreeable to plan of care. IV site, IVF and medications needed. Patient has become volatile and has stated "If you try to stick me with that catheter I will everyone gets stuck too". Provider has been paged x2, awaiting orders.

## 2015-11-29 NOTE — Progress Notes (Signed)
Pt continues to pull IV line and telemetry off. Dr. Allena Katz came to bedside to explain to patient that he really needs IV fluid. Pt continues to refuse. RN paused fluid for now and will reconnect pt when he is calm.

## 2015-11-29 NOTE — Progress Notes (Signed)
[facility] Progress Note  Patient: Rodney Chandler ZOX:[id number]   PCP: [facility] DOB: Mar 10, 1986   DOA: 11/28/2015   DOS: 11/29/2015   Date of Service: the patient was seen and examined on 11/29/2015  Subjective: The patient throughout the day has refused treatment and remained agitated. He did not have any complaints of back pain this morning, but was significantly lethargic, tremulous and off balance. Nutrition: Tolerating oral diet Activity: Walking In the room Last BM: 11/26/2015  Assessment and Plan: 1. Rhabdomyolysis Neuroleptic malignant syndrome. Schizoaffective disorder with bipolar disorder. Substance abuse with marijuana. Mania on presentation.  Patient recently admitted for neuroleptic malignant syndrome and left AMA. He came back to the hospital with worsening back pain as well as worsening CK level. His temperature on admission remain elevated. Patient was admitted to the hospital again for neuroleptic malignant syndrome. His home medications were discontinued. A safety sitter was placed due to patient's agitation on admission with mania. Last admission psychiatric recommended to consider IVC for the patient due to his lack of insight and therefore the patient was IVC on admission  Appreciate input from neurology as well as psychiaty. Also discussed with patient's transition care team from [facility], as the patient recently has been discharged from [facility].  Psychiatry after evaluating the patient recommended to continue IVC, continue bedside sitter, give him IV Ativan and Benadryl to calm his agitation with concerns regarding mania, as well as recommended to use restraint if needed. Psychiatry considers is that the patient does not have insight into his medical condition at present. Psychiatry also recommended other medication changes which has been ordered.  Repeat CK has improved and neurology at present recommends that patient is getting  better as far as neuroleptic malignant syndrome is concerned.  DVT Prophylaxis: subcutaneous Heparin Nutrition: Regular diet Advance goals of care discussion: full code  Brief Summary of Hospitalization:  HPI: As per the H and P dictated on admission, "30 yo bipolar schizophrenic recently started on Prolixin and seen in [facility] ED 2/11 for suspected tardive dyskinesia and treated with ativan and he walked out of ER. Came back on 2/12 with decreased LOC, dull affect, fever increased drooling, mild nuchal rigidity. In the setting of elevated CK of 255 and white count of 11.5 and a fever of 101.1, the possibility of CNS infection was raised. Patient started on empiric antibiotics. Patient had LP on 11/25/15 which was negative. Neurology saw the patient on 2/12 and recommended Dantrolene, if the patient continued to deteriorate further. He was transferred to [facility] 2/12 because of need for Neuro input. Subsequently admitted to ICU, CT of head did not show any intracranial mass. Patient moved to MedSurg unit on 2/13 from where he left AGAINST MEDICAL ADVICE. Patient does not provide much history, noted to have abnormal facial movements which were felt to represent tardive dyskinesia.  Patient's HIV screen was found to be nonreactive, UDS positive for marijuana, CK 2163, pro calcitonin ordered and pending, patient will be transferred to [facility] for neurology evaluation. Patient is currently under IVC" Daily update, Procedures: None Consultants: Neurology and psychiatric Antibiotics: Anti-infectives    None     Family Communication: no family was present at bedside, at the time of interview.   Disposition:  Expected discharge date: 12/01/2015 Barriers to safe discharge: Improvement in mania   Intake/Output Summary (Last 24 hours) at 11/29/15 1822 Last data filed at 11/29/15 1658  Gross per 24 hour  Intake 2307.5 ml  Output  1050 ml  Net 1257.5 ml   There were no vitals filed for this  visit.  Objective: Physical Exam: Filed Vitals:   11/28/15 2218 11/29/15 0414 11/29/15 0455 11/29/15 1545  BP: 123/61  111/76 114/85  Pulse: 68  77   Temp: 97.9 F (36.6 C)  98.3 F (36.8 C) 98.1 F (36.7 C)  TempSrc: Oral  Oral Oral  Resp: 16  16 18   Height:  5\' 11"  (1.803 m)    SpO2: 100%  99% 100%     General: Appear in marked distress, no Rash; Oral Mucosa moist. Cardiovascular: S1 and S2 Present, no Murmur, no JVD Respiratory: Bilateral Air entry present and Clear to Auscultation, no Crackles, no wheezes Abdomen: Bowel Sound present, Soft and n tenderness Extremities: no Pedal edema, no calf tenderness Neurology: Significantly agitated and tremulous  Data Reviewed: CBC:  Recent Labs Lab 11/25/15 0730 11/25/15 1425 11/26/15 0415 11/27/15 0635 11/28/15 0338 11/28/15 0800  WBC 11.5* 8.8 8.2 9.1 10.7* 9.8  NEUTROABS 8.8* 5.7  --  5.7  --   --   HGB 13.2 13.2 13.1 14.0 13.2 13.1  HCT 40.0 40.2 39.6 41.1 39.2 39.6  MCV 97.8 97.6 96.6 97.2 97.8 97.8  PLT 269 239 236 239 258 220   Basic Metabolic Panel:  Recent Labs Lab 11/25/15 0730 11/25/15 1425 11/26/15 0415 11/27/15 0635 11/28/15 0338 11/28/15 0800  NA 139 141 138 141 140  --   K 4.0 3.8 3.7 3.7 4.1  --   CL 102 102 101 104 107  --   CO2 28 24 24 25 24   --   GLUCOSE 80 100* 123* 108* 109*  --   BUN 6 <5* 6 9 11   --   CREATININE 0.83 0.87 0.93 0.97 0.69 0.71  CALCIUM 9.9 9.6 9.0 9.1 9.3  --   MG  --   --  1.8 1.9  --   --   PHOS  --   --  3.9  --   --   --    Liver Function Tests:  Recent Labs Lab 11/25/15 0730 11/25/15 1425 11/28/15 0338  AST 47* 43* 82*  ALT 74* 69* 57  ALKPHOS 67 61 67  BILITOT 0.9 0.7 0.6  PROT 7.6 6.8 7.6  ALBUMIN 4.4 3.7 4.3   No results for input(s): LIPASE, AMYLASE in the last 168 hours. No results for input(s): AMMONIA in the last 168 hours.  Cardiac Enzymes:  Recent Labs Lab 11/26/15 0415 11/27/15 0635 11/28/15 0338 11/28/15 1741 11/29/15 1547   CKTOTAL 967* 1883* 2163* 1538* 731*  CKMB  --  3.4  --   --   --     BNP (last 3 results) No results for input(s): BNP in the last 8760 hours.  CBG:  Recent Labs Lab 11/25/15 1545 11/25/15 2056 11/26/15 0012 11/26/15 0350 11/26/15 0814  GLUCAP 90 121* 102* 73 77    Recent Results (from the past 240 hour(s))  Culture, blood (Routine X 2) w Reflex to ID Panel     Status: None (Preliminary result)   Collection Time: 11/25/15 11:30 AM  Result Value Ref Range Status   Specimen Description BLOOD RIGHT ANTECUBITAL  Final   Special Requests BOTTLES DRAWN AEROBIC AND ANAEROBIC 10CC  Final   Culture   Final    NO GROWTH 4 DAYS Performed at [facility]    Report Status PENDING  Incomplete  Culture, blood (Routine X 2) w Reflex to ID Panel  Status: None (Preliminary result)   Collection Time: 11/25/15 11:30 AM  Result Value Ref Range Status   Specimen Description BLOOD RIGHT ANTECUBITAL  Final   Special Requests IN PEDIATRIC BOTTLE 2.5CC  Final   Culture   Final    NO GROWTH 4 DAYS Performed at [facility]    Report Status PENDING  Incomplete  Culture, blood (routine x 2)     Status: None (Preliminary result)   Collection Time: 11/25/15  2:25 PM  Result Value Ref Range Status   Specimen Description BLOOD LEFT ANTECUBITAL  Final   Special Requests BOTTLES DRAWN AEROBIC AND ANAEROBIC 5CC  Final   Culture NO GROWTH 4 DAYS  Final   Report Status PENDING  Incomplete  Culture, blood (routine x 2)     Status: None (Preliminary result)   Collection Time: 11/25/15  2:34 PM  Result Value Ref Range Status   Specimen Description BLOOD LEFT FOREARM  Final   Special Requests BOTTLES DRAWN AEROBIC ONLY 5CC  Final   Culture NO GROWTH 4 DAYS  Final   Report Status PENDING  Incomplete  CSF culture     Status: None   Collection Time: 11/25/15  4:15 PM  Result Value Ref Range Status   Specimen Description CSF  Final   Special Requests NONE  Final   Gram Stain   Final     CYTOSPIN SMEAR WBC PRESENT, PREDOMINANTLY PMN NO ORGANISMS SEEN CONFIRMED BY V WILLIAMS    Culture NO GROWTH 3 DAYS  Final   Report Status 11/29/2015 FINAL  Final  MRSA PCR Screening     Status: None   Collection Time: 11/25/15 10:51 PM  Result Value Ref Range Status   MRSA by PCR NEGATIVE NEGATIVE Final    Comment:        The GeneXpert MRSA Assay (FDA approved for NASAL specimens only), is one component of a comprehensive MRSA colonization surveillance program. It is not intended to diagnose MRSA infection nor to guide or monitor treatment for MRSA infections.      Studies: No results found.   Scheduled Meds: . amantadine  100 mg Oral BID  . ARIPiprazole  5 mg Oral BID  . diphenhydrAMINE  25 mg Intramuscular Once  . enoxaparin (LOVENOX) injection  40 mg Subcutaneous Q24H  . guaiFENesin  1,200 mg Oral BID  . LORazepam  1 mg Intramuscular Once  . traZODone  100 mg Oral QHS   Continuous Infusions: . sodium chloride Stopped (11/29/15 1100)   PRN Meds: acetaminophen, benztropine, diphenhydrAMINE, LORazepam, LORazepam, ondansetron, senna-docusate  Time spent: 30 minutes  Author: Lynden Oxford, MD [facility] Pager: [phone] 11/29/2015 6:22 PM  If 7PM-7AM, please contact night-coverage at [email or web address], password [facility]

## 2015-11-29 NOTE — Progress Notes (Signed)
RN discussed new order for non-violent restraint with patient because patient is refusing medical treatment. Patient stated he will allow staff RN to place IV site and will not pull site out and he is also agreeable to taking his medications. We will not apply restraint at this time as patient is agreeable to treatment.

## 2015-11-29 NOTE — Progress Notes (Signed)
Subjective: Patient continues to be confused and agitated. He has been afebrile and has had no complaints of muscle stiffness.  Objective: Current vital signs: BP 114/85 mmHg  Pulse 77  Temp(Src) 98.1 F (36.7 C) (Oral)  Resp 18  Ht  (1.803 m)  SpO2 100%  Neurologic Exam: Patient was awake and alert and was confused with tendency to confabulate. Patient had no difficulty with ambulating without assistance although I am use of his IV pole. Extraocular movements were full and conjugate. Face was symmetrical Speech was normal. Strength and muscle tone was normal throughout.  Serum CK today was 731, down from 2163 on 11/28/2015.  Medications: I have reviewed the patient's current medications.  Assessment/Plan: 30 year old man admitted with probable manifestations of early neuroleptic malignant syndrome associated with recent commencement of treatment with Prolixin. This medication has been discontinued. He is no longer febrile and CK levels are trending downward rapidly. He has no remaining clinical symptoms of neuroleptic malignant syndrome.  No further neurological intervention is indicated acutely. I will plan to see him in follow-up on an as-needed basis following this visit.  C.R. Roseanne Reno, MD [facility] [phone]  11/29/2015  5:06 PM

## 2015-11-29 NOTE — Progress Notes (Signed)
Pt had two visitors this morning. Pt asking about getting his coat back. Coat was given to visitors on the way off the unit.

## 2015-11-29 NOTE — Consult Note (Signed)
[place] Psychiatry Consult   Reason for Consult:  Neuroleptic malignant syndrome secondary to Haldol and Prolixin  Referring Physician:  Dr. Posey Pronto Patient Identification: Rodney Chandler MRN:  [id number] Principal Diagnosis: Rhabdomyolysis Diagnosis:   Patient Active Problem List   Diagnosis Date Noted  . Schizoaffective disorder ([place]) [F25.9] 11/28/2015  . Rhabdomyolysis [M62.82] 11/28/2015  . Abnormal creatine kinase level [R74.8]   . NMS (neuroleptic malignant syndrome) [G21.0] 11/25/2015  . Change in mental state [R41.82] 11/25/2015  . Homicidal ideation [R45.850]   . Mania ([place]) [F30.9]   . Bipolar affective disorder, current episode manic with psychotic symptoms ([place]) [F31.2] 09/17/2015  . Cannabis use disorder, severe, dependence ([place]) [F12.20] 09/17/2015  . Concussion with loss of consciousness <= 30 min [S06.0X1A] 09/17/2013  . Acute respiratory failure ([place]) [J96.00] 09/17/2013  . 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 Medication management [Z79.899] 08/31/2013    Total Time spent with patient: 1 hour  Subjective:   Rodney Chandler is a 30 y.o. male admitted within 24 hours of AMA from [facility] for neuroleptic malignant syndrome and his CK levels are trending up.   HPI: Rodney Chandler 30 yo male seen, chart reviewed and case discussed with Dr. Posey Pronto, Staff RN and LCSW and reviewed IVC petition and certification completed by [facility] physician which are in good standing. Patient seen for face-to-face psychiatric consultation and evaluation for schizoaffective disorder,NMS associated with antipsychotic medication Haldol and Prolixin and required Intensive care placement. He was discharged on 11/22/2015 from [facility] and within few days admitted to [place] from [facility] for NMS. Patient has back pain, chest pain and unsteady on his feet, teeth are chattering.   He was seen by neurology who confirm his diagnosis and recommend medication treatment with  liberal hydration for management of elevated CK, as planned, as well as serial CK level checks, Will use of dantrolene sodium or bromocriptine for now, as patient has normal muscle tone when relaxed and continue Ativan as needed for management of agitation.  Patient also known for substance abuse especially marijuana and alcohol. Patient is hypomanic with the grandiose thoughts and restlessness. Patient has a history of noncompliant with his medication management and receiving outpatient psychiatric services from the [facility] mental health.  Past Psychiatric History: Patient with schizoaffective disorder, polysubstance abuse and noncompliance and had multiple visits to the [facility] emergency department. Recent discharge from [facility].   Risk to Self: Is patient at risk for suicide?: No Risk to Others:   Prior Inpatient Therapy:   Prior Outpatient Therapy:    Past Medical History:  Past Medical History  Diagnosis Date  . Bipolar 1 disorder ([place])   . Manic depression ([place])   . Schizophrenia ([place])   . Mandibular fracture ([place]) 09/17/2013    Past Surgical History  Procedure Laterality Date  . Orif mandibular fracture N/A 09/17/2013    Procedure: OPEN REDUCTION INTERNAL FIXATION (ORIF) MANDIBULAR FRACTURE;  Surgeon: Ascencion Dike, MD;  Location: [place];  Service: ENT;  Laterality: N/A;  . Mouth surgery    . Mandibular hardware removal N/A 10/18/2013    Procedure: MANDIBULAR HARDWARE REMOVAL;  Surgeon: Ascencion Dike, MD;  Location: [place];  Service: ENT;  Laterality: N/A;   Family History: History reviewed. No pertinent family history. Family Psychiatric  History: None Social History:  History  Alcohol Use  . Yes    Comment: Weekends.      History  Drug Use  . 14.00 per week  .  Special: Marijuana    Comment: Last used: 3 hours ago    Social History   Social History  . Marital Status: Single    Spouse Name: N/A  . Number of Children: N/A  . Years of Education: N/A    Social History Main Topics  . Smoking status: Former Smoker -- 0.50 packs/day    Types: Cigarettes  . Smokeless tobacco: Never Used  . Alcohol Use: Yes     Comment: Weekends.   . Drug Use: 14.00 per week    Special: Marijuana     Comment: Last used: 3 hours ago  . Sexual Activity: Yes   Other Topics Concern  . None   Social History Narrative   Additional Social History: Patient stated he is living with a friend and planning to lease an apartment  and by a car.     Allergies:   Allergies  Allergen Reactions  . Prolixin [Fluphenazine] Other (See Comments)    Slurred speech, foam at the mouth, spit   . Lithium Other (See Comments)    NOSEBLEED  . Depakote [Divalproex Sodium] Other (See Comments)    NOSEBLEED  . Zyprexa [Olanzapine] Other (See Comments)    NOSEBLEED    Labs:  Results for orders placed or performed during the hospital encounter of 11/28/15 (from the past 48 hour(s))  Comprehensive metabolic panel     Status: Abnormal   Collection Time: 11/28/15  3:38 AM  Result Value Ref Range   Sodium 140 135 - 145 mmol/L   Potassium 4.1 3.5 - 5.1 mmol/L   Chloride 107 101 - 111 mmol/L   CO2 24 22 - 32 mmol/L   Glucose, Bld 109 (H) 65 - 99 mg/dL   BUN 11 6 - 20 mg/dL   Creatinine, Ser 0.69 0.61 - 1.24 mg/dL   Calcium 9.3 8.9 - 10.3 mg/dL   Total Protein 7.6 6.5 - 8.1 g/dL   Albumin 4.3 3.5 - 5.0 g/dL   AST 82 (H) 15 - 41 U/L   ALT 57 17 - 63 U/L   Alkaline Phosphatase 67 38 - 126 U/L   Total Bilirubin 0.6 0.3 - 1.2 mg/dL   GFR calc non Af Amer >60 >60 mL/min   GFR calc Af Amer >60 >60 mL/min    Comment: (NOTE) The eGFR has been calculated using the CKD EPI equation. This calculation has not been validated in all clinical situations. eGFR's persistently <60 mL/min signify possible Chronic Kidney Disease.    Anion gap 9 5 - 15  Ethanol (ETOH)     Status: None   Collection Time: 11/28/15  3:38 AM  Result Value Ref Range   Alcohol, Ethyl (B) <5 <5 mg/dL     Comment:        LOWEST DETECTABLE LIMIT FOR SERUM ALCOHOL IS 5 mg/dL FOR MEDICAL PURPOSES ONLY   Salicylate level     Status: None   Collection Time: 11/28/15  3:38 AM  Result Value Ref Range   Salicylate Lvl <5.3 2.8 - 30.0 mg/dL  Acetaminophen level     Status: Abnormal   Collection Time: 11/28/15  3:38 AM  Result Value Ref Range   Acetaminophen (Tylenol), Serum <10 (L) 10 - 30 ug/mL    Comment:        THERAPEUTIC CONCENTRATIONS VARY SIGNIFICANTLY. A RANGE OF 10-30 ug/mL MAY BE AN EFFECTIVE CONCENTRATION FOR MANY PATIENTS. HOWEVER, SOME ARE BEST TREATED AT CONCENTRATIONS OUTSIDE THIS RANGE. ACETAMINOPHEN CONCENTRATIONS >150 ug/mL AT 4 HOURS AFTER  INGESTION AND >50 ug/mL AT 12 HOURS AFTER INGESTION ARE OFTEN ASSOCIATED WITH TOXIC REACTIONS.   CBC     Status: Abnormal   Collection Time: 11/28/15  3:38 AM  Result Value Ref Range   WBC 10.7 (H) 4.0 - 10.5 K/uL   RBC 4.01 (L) 4.22 - 5.81 MIL/uL   Hemoglobin 13.2 13.0 - 17.0 g/dL   HCT 39.2 39.0 - 52.0 %   MCV 97.8 78.0 - 100.0 fL   MCH 32.9 26.0 - 34.0 pg   MCHC 33.7 30.0 - 36.0 g/dL   RDW 13.0 11.5 - 15.5 %   Platelets 258 150 - 400 K/uL  CK     Status: Abnormal   Collection Time: 11/28/15  3:38 AM  Result Value Ref Range   Total CK 2163 (H) 49 - 397 U/L  Urine rapid drug screen (hosp performed) (Not at [facility])     Status: Abnormal   Collection Time: 11/28/15  5:16 AM  Result Value Ref Range   Opiates NONE DETECTED NONE DETECTED   Cocaine NONE DETECTED NONE DETECTED   Benzodiazepines NONE DETECTED NONE DETECTED   Amphetamines NONE DETECTED NONE DETECTED   Tetrahydrocannabinol POSITIVE (A) NONE DETECTED   Barbiturates NONE DETECTED NONE DETECTED    Comment:        DRUG SCREEN FOR MEDICAL PURPOSES ONLY.  IF CONFIRMATION IS NEEDED FOR ANY PURPOSE, NOTIFY LAB WITHIN 5 DAYS.        LOWEST DETECTABLE LIMITS FOR URINE DRUG SCREEN Drug Class       Cutoff (ng/mL) Amphetamine      1000 Barbiturate       200 Benzodiazepine   782 Tricyclics       956 Opiates          300 Cocaine          300 THC              50   CBC     Status: Abnormal   Collection Time: 11/28/15  8:00 AM  Result Value Ref Range   WBC 9.8 4.0 - 10.5 K/uL   RBC 4.05 (L) 4.22 - 5.81 MIL/uL   Hemoglobin 13.1 13.0 - 17.0 g/dL   HCT 39.6 39.0 - 52.0 %   MCV 97.8 78.0 - 100.0 fL   MCH 32.3 26.0 - 34.0 pg   MCHC 33.1 30.0 - 36.0 g/dL   RDW 12.9 11.5 - 15.5 %   Platelets 220 150 - 400 K/uL  Creatinine, serum     Status: None   Collection Time: 11/28/15  8:00 AM  Result Value Ref Range   Creatinine, Ser 0.71 0.61 - 1.24 mg/dL   GFR calc non Af Amer >60 >60 mL/min   GFR calc Af Amer >60 >60 mL/min    Comment: (NOTE) The eGFR has been calculated using the CKD EPI equation. This calculation has not been validated in all clinical situations. eGFR's persistently <60 mL/min signify possible Chronic Kidney Disease.   CK     Status: Abnormal   Collection Time: 11/28/15  5:41 PM  Result Value Ref Range   Total CK 1538 (H) 49 - 397 U/L    Current Facility-Administered Medications  Medication Dose Route Frequency Provider Last Rate Last Dose  . 0.9 %  sodium chloride infusion   Intravenous Continuous Reyne Dumas, MD 150 mL/hr at 11/29/15 0912    . acetaminophen (TYLENOL) tablet 650 mg  650 mg Oral Q4H PRN Reyne Dumas, MD   650 mg  at 11/29/15 0152  . amantadine (SYMMETREL) capsule 100 mg  100 mg Oral BID Reyne Dumas, MD   100 mg at 11/29/15 0905  . enoxaparin (LOVENOX) injection 40 mg  40 mg Subcutaneous Q24H Reyne Dumas, MD   40 mg at 11/28/15 1005  . guaiFENesin (MUCINEX) 12 hr tablet 1,200 mg  1,200 mg Oral BID Rhetta Mura Schorr, NP   1,200 mg at 11/29/15 0903  . LORazepam (ATIVAN) injection 2 mg  2 mg Intravenous Q6H PRN Reyne Dumas, MD   2 mg at 11/29/15 0903  . LORazepam (ATIVAN) tablet 1 mg  1 mg Oral BID PRN Reyne Dumas, MD   1 mg at 11/29/15 0152  . ondansetron (ZOFRAN) injection 4 mg  4 mg Intravenous Q4H  PRN Reyne Dumas, MD      . senna-docusate (Senokot-S) tablet 1 tablet  1 tablet Oral QHS PRN Reyne Dumas, MD      . traZODone (DESYREL) tablet 100 mg  100 mg Oral QHS Reyne Dumas, MD   100 mg at 11/28/15 2137    Musculoskeletal: Strength & Muscle Tone: stiff muscles Gait & Station: patient did not stand, not stable Patient leans: N/A  Psychiatric Specialty Exam: Review of Systems  Constitutional: Positive for fever and diaphoresis.  HENT: Negative.   Eyes: Negative.   Respiratory: Negative.   Cardiovascular: Negative.   Gastrointestinal: Negative.   Genitourinary: Negative.   Musculoskeletal: Positive for myalgias.  Skin: Negative.   Neurological: Positive for sensory change and seizures.  Endo/Heme/Allergies: Negative.   Psychiatric/Behavioral: Positive for substance abuse.    Blood pressure 111/76, pulse 77, temperature 98.3 F (36.8 C), temperature source Oral, resp. rate 16, height _0  (1.803 m), SpO2 99 %.There is no weight on file to calculate BMI.  General Appearance: Casual  Eye Contact::  Good  Speech:  Clear and Coherent  Volume:  loud  Mood:  Anxious and Irritable  Affect:  Inappropriate and Labile  Thought Process:  Coherent and Goal Directed  Orientation:  Full (Time, Place, and Person)  Thought Content:  Rumination  Suicidal Thoughts:  No  Homicidal Thoughts:  No  Memory:  intact  Judgement:  Impaired  Insight:  Lacking  Psychomotor Activity:  Restlessness  Concentration:  Fair  Recall:  Good  Fund of Knowledge:Fair  Language: Good  Akathisia:  Yes  Handed:  Right  AIMS (if indicated):     Assets:  Housing Leisure Time Resilience Social Support  ADL's:  Intact  Cognition: Impaired,  Severe  Sleep:      Treatment Plan Summary: Daily contact with patient to assess and evaluate symptoms and progress in treatment, Medication management and Plan neuroleptic malignant syndrome:   Patient should be monitored while recovering from neuroleptic  malignant syndrome and introducing atypical antipsychotics to prevent further neuroleptic malignant syndrome and a disabled and controlling his schizoaffective disorder including grandiosity, mood swings, agitation and risk taking behaviors.   Patient has been on IVC Chief Technology Officer Restart Abilify 5 mg PO BID schizoaffective, safe for this patient which does not cause NMS Start Cogentin 0.5 mg PO BID/PRN for EPS Start Ativan 2 mg PO Q6H  PRN Start Benadryl 50 mg PO Q6H for agitation for muscle rigidity and difficulty swallowing Discontinued Prolixin and Haldol due to NMS  Disposition: Refer to ACT services and [facility] for medication management when medically stable Patient does not meet criteria for psychiatric inpatient admission. Supportive therapy provided about ongoing stressors.  Durward Parcel., MD 11/29/2015 10:13  AM

## 2015-11-30 LAB — COMPREHENSIVE METABOLIC PANEL
ALBUMIN: 4.2 g/dL (ref 3.5–5.0)
ALK PHOS: 59 U/L (ref 38–126)
ALT: 40 U/L (ref 17–63)
ANION GAP: 10 (ref 5–15)
AST: 38 U/L (ref 15–41)
BILIRUBIN TOTAL: 1.2 mg/dL (ref 0.3–1.2)
BUN: 10 mg/dL (ref 6–20)
CALCIUM: 9.3 mg/dL (ref 8.9–10.3)
CO2: 25 mmol/L (ref 22–32)
CREATININE: 0.9 mg/dL (ref 0.61–1.24)
Chloride: 105 mmol/L (ref 101–111)
Glucose, Bld: 97 mg/dL (ref 65–99)
Potassium: 3.9 mmol/L (ref 3.5–5.1)
SODIUM: 140 mmol/L (ref 135–145)
TOTAL PROTEIN: 7.4 g/dL (ref 6.5–8.1)

## 2015-11-30 LAB — CULTURE, BLOOD (ROUTINE X 2)
CULTURE: NO GROWTH
CULTURE: NO GROWTH
Culture: NO GROWTH
Culture: NO GROWTH

## 2015-11-30 LAB — PHOSPHORUS: PHOSPHORUS: 3.1 mg/dL (ref 2.5–4.6)

## 2015-11-30 LAB — MAGNESIUM: MAGNESIUM: 1.9 mg/dL (ref 1.7–2.4)

## 2015-11-30 LAB — CK: CK TOTAL: 476 U/L — AB (ref 49–397)

## 2015-11-30 MED ORDER — ARIPIPRAZOLE 10 MG PO TABS
10.0000 mg | ORAL_TABLET | Freq: Two times a day (BID) | ORAL | Status: DC
  Administered 2015-11-30 – 2015-12-01 (×2): 10 mg via ORAL
  Filled 2015-11-30 (×5): qty 1

## 2015-11-30 MED ORDER — LORAZEPAM 1 MG PO TABS
2.0000 mg | ORAL_TABLET | Freq: Three times a day (TID) | ORAL | Status: DC
  Administered 2015-11-30 – 2015-12-01 (×2): 2 mg via ORAL
  Filled 2015-11-30 (×2): qty 2

## 2015-11-30 MED ORDER — LORAZEPAM 1 MG PO TABS: 1.0000 mg | ORAL_TABLET | ORAL | Status: DC | PRN

## 2015-11-30 MED ORDER — LORAZEPAM 2 MG/ML IJ SOLN: 1.0000 mg | Freq: Once | INTRAMUSCULAR | Status: DC

## 2015-11-30 NOTE — Progress Notes (Signed)
CSW consulted to assist with d/c planning. PN reviewed. Pt remains agitated. IVC remains appropriate. CSW will assist with referral to ACT prior to d/c.   Cori Razor LCSW [phone]

## 2015-11-30 NOTE — Consult Note (Signed)
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 Posey Pronto Patient Identification: Rodney Chandler MRN:  [id number] Principal Diagnosis: Rhabdomyolysis Diagnosis:   Patient Active Problem List   Diagnosis Date Noted  . Dystonia [G24.9]   . Non-traumatic rhabdomyolysis [M62.82]   . Schizoaffective disorder, bipolar type ([place]) [F25.0]   . Schizoaffective disorder ([place]) [F25.9] 11/28/2015  . Rhabdomyolysis [M62.82] 11/28/2015  . Abnormal creatine kinase level [R74.8]   . NMS (neuroleptic malignant syndrome) [G21.0] 11/25/2015  . Change in mental state [R41.82] 11/25/2015  . Homicidal ideation [R45.850]   . Mania ([place]) [F30.9]   . Bipolar affective disorder, current episode manic with psychotic symptoms ([place]) [F31.2] 09/17/2015  . Cannabis use disorder, severe, dependence ([place]) [F12.20] 09/17/2015  . Concussion with loss of consciousness <= 30 min [S06.0X1A] 09/17/2013  . Acute respiratory failure ([place]) [J96.00] 09/17/2013  . 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 Medication management [Z79.899] 08/31/2013    Total Time spent with patient: 30 minutes  Subjective:   Rodney Chandler is a 30 y.o. male admitted within 24 hours of AMA from [facility] hospital for neuroleptic malignant syndrome and his CK levels are trending up.   HPI: Rodney Chandler 30 yo male seen, chart reviewed and case discussed with Dr. Posey Pronto, Staff RN and LCSW and reviewed IVC petition and certification completed by [facility] physician which are in good standing. Patient seen for face-to-face psychiatric consultation and evaluation for schizoaffective disorder,NMS associated with antipsychotic medication Haldol and Prolixin and required Intensive care placement. He was discharged on 11/22/2015 from [facility] and within few days admitted to [facility] from [facility] for NMS. Patient has back pain, chest pain and  unsteady on his feet, teeth are chattering. He was seen by neurology who confirm his diagnosis and recommend medication treatment with liberal hydration for management of elevated CK, as planned, as well as serial CK level checks, Will use of dantrolene sodium or bromocriptine for now, as patient has normal muscle tone when relaxed and continue Ativan as needed for management of agitation. Patient also known for substance abuse especially marijuana and alcohol. Patient is hypomanic with the grandiose thoughts and restlessness. Patient has a history of noncompliant with his medication management and receiving outpatient psychiatric services from the [facility] mental health.  Past Psychiatric History: Patient with schizoaffective disorder, polysubstance abuse and noncompliance and had multiple visits to the [facility] emergency department. Recent discharge from [facility].   Interval history: patient has been hypomanic to manic, irritable, agitated and demanding to be discharge when he has elevated CK and recent NMS. Patient denied si/HI. He may need to follow up with ACT team when medically stable . Will ask LCSW to contact ACT services and family prior to discharge to prevent frequent ER visit and readmissions.  Risk to Self: Is patient at risk for suicide?: No Risk to Others:   Prior Inpatient Therapy:   Prior Outpatient Therapy:    Past Medical History:  Past Medical History  Diagnosis Date  . Bipolar 1 disorder ([place])   . Manic depression ([place])   . Schizophrenia ([place])   . Mandibular fracture ([place]) 09/17/2013    Past Surgical History  Procedure Laterality Date  . Orif mandibular fracture N/A 09/17/2013    Procedure: OPEN REDUCTION INTERNAL FIXATION (ORIF) MANDIBULAR FRACTURE;  Surgeon: Ascencion Dike, MD;  Location: [facility];  Service: ENT;  Laterality: N/A;  . Mouth surgery    .  Mandibular hardware removal N/A 10/18/2013    Procedure: MANDIBULAR HARDWARE REMOVAL;  Surgeon: Ascencion Dike, MD;  Location: [place];  Service: ENT;  Laterality: N/A;   Family History: History reviewed. No pertinent family history. Family Psychiatric  History: None Social History:  History  Alcohol Use  . Yes    Comment: Weekends.      History  Drug Use  . 14.00 per week  . Special: Marijuana    Comment: Last used: 3 hours ago    Social History   Social History  . Marital Status: Single    Spouse Name: N/A  . Number of Children: N/A  . Years of Education: N/A   Social History Main Topics  . Smoking status: Former Smoker -- 0.50 packs/day    Types: Cigarettes  . Smokeless tobacco: Never Used  . Alcohol Use: Yes     Comment: Weekends.   . Drug Use: 14.00 per week    Special: Marijuana     Comment: Last used: 3 hours ago  . Sexual Activity: Yes   Other Topics Concern  . None   Social History Narrative   Additional Social History: Patient stated he is living with a friend and planning to lease an apartment  and by a car.     Allergies:   Allergies  Allergen Reactions  . Prolixin [Fluphenazine] Other (See Comments)    Slurred speech, foam at the mouth, spit   . Lithium Other (See Comments)    NOSEBLEED  . Depakote [Divalproex Sodium] Other (See Comments)    NOSEBLEED  . Zyprexa [Olanzapine] Other (See Comments)    NOSEBLEED    Labs:  Results for orders placed or performed during the hospital encounter of 11/28/15 (from the past 48 hour(s))  CK     Status: Abnormal   Collection Time: 11/28/15  5:41 PM  Result Value Ref Range   Total CK 1538 (H) 49 - 397 U/L  CK     Status: Abnormal   Collection Time: 11/29/15  3:47 PM  Result Value Ref Range   Total CK 731 (H) 49 - 397 U/L  Comprehensive metabolic panel     Status: None   Collection Time: 11/30/15  6:00 AM  Result Value Ref Range   Sodium 140 135 - 145 mmol/L   Potassium 3.9 3.5 - 5.1 mmol/L   Chloride 105 101 - 111 mmol/L   CO2 25 22 - 32 mmol/L   Glucose, Bld 97 65 - 99 mg/dL   BUN 10 6 - 20 mg/dL    Creatinine, Ser 0.90 0.61 - 1.24 mg/dL   Calcium 9.3 8.9 - 10.3 mg/dL   Total Protein 7.4 6.5 - 8.1 g/dL   Albumin 4.2 3.5 - 5.0 g/dL   AST 38 15 - 41 U/L   ALT 40 17 - 63 U/L   Alkaline Phosphatase 59 38 - 126 U/L   Total Bilirubin 1.2 0.3 - 1.2 mg/dL   GFR calc non Af Amer >60 >60 mL/min   GFR calc Af Amer >60 >60 mL/min    Comment: (NOTE) The eGFR has been calculated using the CKD EPI equation. This calculation has not been validated in all clinical situations. eGFR's persistently <60 mL/min signify possible Chronic Kidney Disease.    Anion gap 10 5 - 15  Magnesium     Status: None   Collection Time: 11/30/15  6:00 AM  Result Value Ref Range   Magnesium 1.9 1.7 - 2.4 mg/dL  Phosphorus     Status: None   Collection Time: 11/30/15  6:00 AM  Result Value Ref Range   Phosphorus 3.1 2.5 - 4.6 mg/dL  CK     Status: Abnormal   Collection Time: 11/30/15  6:00 AM  Result Value Ref Range   Total CK 476 (H) 49 - 397 U/L    Current Facility-Administered Medications  Medication Dose Route Frequency Provider Last Rate Last Dose  . 0.9 %  sodium chloride infusion   Intravenous Continuous Reyne Dumas, MD   Stopped at 11/30/15 0200  . acetaminophen (TYLENOL) tablet 650 mg  650 mg Oral Q4H PRN Reyne Dumas, MD   650 mg at 11/29/15 0152  . amantadine (SYMMETREL) capsule 100 mg  100 mg Oral BID Reyne Dumas, MD   100 mg at 11/30/15 1002  . ARIPiprazole (ABILIFY) tablet 10 mg  10 mg Oral BID Ambrose Finland, MD      . benztropine (COGENTIN) tablet 0.5 mg  0.5 mg Oral BID PRN Ambrose Finland, MD   0.5 mg at 11/29/15 2006  . diphenhydrAMINE (BENADRYL) capsule 25 mg  25 mg Oral Q6H PRN Ambrose Finland, MD   25 mg at 11/29/15 2006  . enoxaparin (LOVENOX) injection 40 mg  40 mg Subcutaneous Q24H Reyne Dumas, MD   40 mg at 11/30/15 1002  . guaiFENesin (MUCINEX) 12 hr tablet 1,200 mg  1,200 mg Oral BID Rhetta Mura Schorr, NP   1,200 mg at 11/30/15 1002  . LORazepam (ATIVAN)  injection 2 mg  2 mg Intravenous Q6H PRN Reyne Dumas, MD   2 mg at 11/29/15 1959  . LORazepam (ATIVAN) tablet 2 mg  2 mg Oral TID Ambrose Finland, MD      . ondansetron (ZOFRAN) injection 4 mg  4 mg Intravenous Q4H PRN Reyne Dumas, MD      . senna-docusate (Senokot-S) tablet 1 tablet  1 tablet Oral QHS PRN Reyne Dumas, MD      . traZODone (DESYREL) tablet 100 mg  100 mg Oral QHS Reyne Dumas, MD   100 mg at 11/29/15 2200    Musculoskeletal: Strength & Muscle Tone: stiff muscles Gait & Station: patient did not stand, not stable Patient leans: N/A  Psychiatric Specialty Exam: Review of Systems  Constitutional: Positive for fever and diaphoresis.  HENT: Negative.   Eyes: Negative.   Respiratory: Negative.   Cardiovascular: Negative.   Gastrointestinal: Negative.   Genitourinary: Negative.   Musculoskeletal: Positive for myalgias.  Skin: Negative.   Neurological: Positive for sensory change and seizures.  Endo/Heme/Allergies: Negative.   Psychiatric/Behavioral: Positive for substance abuse.    Blood pressure 111/57, pulse 83, temperature 97.6 F (36.4 C), temperature source Oral, resp. rate 20, height '5\' 11"'$  (1.803 m), weight 64.139 kg (141 lb 6.4 oz), SpO2 100 %.Body mass index is 19.73 kg/(m^2).  General Appearance: Casual  Eye Contact::  Good  Speech:  Clear and Coherent  Volume:  loud  Mood:  Anxious and Irritable  Affect:  Inappropriate and Labile  Thought Process:  Coherent and Goal Directed  Orientation:  Full (Time, Place, and Person)  Thought Content:  Rumination  Suicidal Thoughts:  No  Homicidal Thoughts:  No  Memory:  intact  Judgement:  Impaired  Insight:  Lacking  Psychomotor Activity:  Restlessness  Concentration:  Fair  Recall:  Good  Fund of Knowledge:Fair  Language: Good  Akathisia:  Yes  Handed:  Right  AIMS (if indicated):     Assets:  Housing Leisure Time Resilience  Social Support  ADL's:  Intact  Cognition: Impaired,  Severe   Sleep:      Treatment Plan Summary: Daily contact with patient to assess and evaluate symptoms and progress in treatment, Medication management and Plan neuroleptic malignant syndrome:   Patient should be monitored while recovering from neuroleptic malignant syndrome and introducing atypical antipsychotics to prevent further neuroleptic malignant syndrome and a disabled and controlling his schizoaffective disorder including grandiosity, mood swings, agitation and risk taking behaviors.   Patient has been on IVC Chief Technology Officer Increase Abilify 10 mg PO BID schizoaffective, safe for this patient which does not cause NMS Continue Cogentin 0.5 mg PO BID/PRN for EPS Change Ativan 2 mg PO TID for hypomanic and anxiety Continue Benadryl 50 mg PO Q6H for agitation for muscle rigidity and difficulty swallowing Discontinued Prolixin and Haldol due to NMS  Disposition: Refer to ACT services and [facility] for medication management when medically stable Patient does not meet criteria for psychiatric inpatient admission. Supportive therapy provided about ongoing stressors.  Durward Parcel., MD 11/30/2015 12:43 PM

## 2015-11-30 NOTE — Progress Notes (Signed)
[facility] Progress Note  Patient: Rodney Chandler:[id number]   PCP: [facility] DOB: 13-Aug-1986   DOA: 11/28/2015   DOS: 11/30/2015   Date of Service: the patient was seen and examined on 11/30/2015  Subjective: Patient continues to refuse IV fluids, tells me that he has left wound after going home, he has money to spend, he has to go to see jewelery shop, he denies any back pain. Patient is hitting the pillow as well as hitting the bed violently. Nutrition: Tolerating oral diet Activity: Walking In the room Last BM: 11/26/2015  Assessment and Plan: 1. Rhabdomyolysis Neuroleptic malignant syndrome. Schizoaffective disorder with bipolar disorder. Substance abuse with marijuana. Mania on presentation.  Patient recently admitted for neuroleptic malignant syndrome and left AMA. He came back to the hospital with worsening back pain as well as worsening CK level. His temperature on admission remain elevated. Patient was admitted to the hospital again for neuroleptic malignant syndrome. His home medications were discontinued. A safety sitter was placed due to patient's agitation on admission with mania. Last admission psychiatric recommended to consider IVC for the patient due to his lack of insight and therefore the patient was IVC on current admission  Appreciate input from neurology as well as psychiaty.   Psychiatry after evaluating the patient recommended to continue IVC, continue bedside sitter, patient's medication has been adjusted for agitation with concerns regarding mania, as well as recommended to use restraint if needed. Requested psychiatry to consider transfer to behavioral health since the patient is showing improvement in CK levels. Psychiatry considers is that the patient does not have insight into his medical condition at present. Currently given patient's agitation the patient will be transferred to step down unit, as the patient is requiring  frequent cueing and assessment.  Repeat CK has improved and neurology at present recommends that patient is getting better as far as neuroleptic malignant syndrome is concerned.  DVT Prophylaxis: subcutaneous Heparin Nutrition: Regular diet Advance goals of care discussion: full code  Brief Summary of Hospitalization:  HPI: As per the H and P dictated on admission, "30 yo bipolar schizophrenic recently started on Prolixin and seen in [facility] ED 2/11 for suspected tardive dyskinesia and treated with ativan and he walked out of ER. Came back on 2/12 with decreased LOC, dull affect, fever increased drooling, mild nuchal rigidity. In the setting of elevated CK of 255 and white count of 11.5 and a fever of 101.1, the possibility of CNS infection was raised. Patient started on empiric antibiotics. Patient had LP on 11/25/15 which was negative. Neurology saw the patient on 2/12 and recommended Dantrolene, if the patient continued to deteriorate further. He was transferred to [facility] 2/12 because of need for Neuro input. Subsequently admitted to ICU, CT of head did not show any intracranial mass. Patient moved to MedSurg unit on 2/13 from where he left AGAINST MEDICAL ADVICE. Patient does not provide much history, noted to have abnormal facial movements which were felt to represent tardive dyskinesia.  Patient's HIV screen was found to be nonreactive, UDS positive for marijuana, CK 2163, pro calcitonin ordered and pending, patient will be transferred to [facility] for neurology evaluation. Patient is currently under IVC" Daily update, Procedures: None Consultants: Neurology and psychiatric Antibiotics: Anti-infectives    None     Family Communication: no family was present at bedside, at the time of interview.   Disposition:  Expected discharge date: 12/01/2015 Barriers to safe discharge: Improvement in mania   Intake/Output  Summary (Last 24 hours) at 11/30/15 1614 Last data filed at 11/30/15 0732  Gross per  24 hour  Intake 3317.5 ml  Output      0 ml  Net 3317.5 ml   Filed Weights   11/30/15 0523  Weight: 64.139 kg (141 lb 6.4 oz)    Objective: Physical Exam: Filed Vitals:   11/29/15 1851 11/29/15 2220 11/30/15 0523 11/30/15 1532  BP: 114/79 108/57 111/57 116/71  Pulse: 60 51 83 58  Temp: 97.7 F (36.5 C) 97.6 F (36.4 C)  98.1 F (36.7 C)  TempSrc: Axillary Oral  Oral  Resp: Height:      Weight:   64.139 kg (141 lb 6.4 oz)   SpO2: 100% 100% 100% 100%    General: Appear in marked distress, no Rash; Oral Mucosa moist. Cardiovascular: S1 and S2 Present, no Murmur, no JVD Respiratory: Bilateral Air entry present and Clear to Auscultation, no Crackles, no wheezes Abdomen: Bowel Sound present, Soft and n tenderness Extremities: no Pedal edema, no calf tenderness Neurology: Significantly agitated and tremulous  Data Reviewed: CBC:  Recent Labs Lab 11/25/15 0730 11/25/15 1425 11/26/15 0415 11/27/15 0635 11/28/15 0338 11/28/15 0800  WBC 11.5* 8.8 8.2 9.1 10.7* 9.8  NEUTROABS 8.8* 5.7  --  5.7  --   --   HGB 13.2 13.2 13.1 14.0 13.2 13.1  HCT 40.0 40.2 39.6 41.1 39.2 39.6  MCV 97.8 97.6 96.6 97.2 97.8 97.8  PLT 269 239 236 239 258 220   Basic Metabolic Panel:  Recent Labs Lab 11/25/15 1425 11/26/15 0415 11/27/15 0635 11/28/15 0338 11/28/15 0800 11/30/15 0600  NA 141 138 141 140  --  140  K 3.8 3.7 3.7 4.1  --  3.9  CL 102 101 104 107  --  105  CO2 --  25  GLUCOSE 100* 123* 108* 109*  --  97  BUN <5* --  10  CREATININE 0.87 0.93 0.97 0.69 0.71 0.90  CALCIUM 9.6 9.0 9.1 9.3  --  9.3  MG  --  1.8 1.9  --   --  1.9  PHOS  --  3.9  --   --   --  3.1   Liver Function Tests:  Recent Labs Lab 11/25/15 0730 11/25/15 1425 11/28/15 0338 11/30/15 0600  AST 47* 43* 82* 38  ALT 74* 69* 57 40  ALKPHOS 67 61 67 59  BILITOT 0.9 0.7 0.6 1.2  PROT 7.6 6.8 7.6 7.4  ALBUMIN 4.4 3.7 4.3 4.2   No results for input(s): LIPASE,  AMYLASE in the last 168 hours. No results for input(s): AMMONIA in the last 168 hours.  Cardiac Enzymes:  Recent Labs Lab 11/27/15 0635 11/28/15 0338 11/28/15 1741 11/29/15 1547 11/30/15 0600  CKTOTAL 1883* 2163* 1538* 731* 476*  CKMB 3.4  --   --   --   --     BNP (last 3 results) No results for input(s): BNP in the last 8760 hours.  CBG:  Recent Labs Lab 11/25/15 1545 11/25/15 2056 11/26/15 0012 11/26/15 0350 11/26/15 0814  GLUCAP 90 121* 102* 73 77    Recent Results (from the past 240 hour(s))  Culture, blood (Routine X 2) w Reflex to ID Panel     Status: None (Preliminary result)   Collection Time: 11/25/15 11:30 AM  Result Value Ref Range Status   Specimen Description BLOOD RIGHT ANTECUBITAL  Final   Special  Requests BOTTLES DRAWN AEROBIC AND ANAEROBIC 10CC  Final   Culture   Final    NO GROWTH 4 DAYS Performed at [facility]    Report Status PENDING  Incomplete  Culture, blood (Routine X 2) w Reflex to ID Panel     Status: None (Preliminary result)   Collection Time: 11/25/15 11:30 AM  Result Value Ref Range Status   Specimen Description BLOOD RIGHT ANTECUBITAL  Final   Special Requests IN PEDIATRIC BOTTLE 2.5CC  Final   Culture   Final    NO GROWTH 4 DAYS Performed at [facility]    Report Status PENDING  Incomplete  Culture, blood (routine x 2)     Status: None (Preliminary result)   Collection Time: 11/25/15  2:25 PM  Result Value Ref Range Status   Specimen Description BLOOD LEFT ANTECUBITAL  Final   Special Requests BOTTLES DRAWN AEROBIC AND ANAEROBIC 5CC  Final   Culture NO GROWTH 4 DAYS  Final   Report Status PENDING  Incomplete  Culture, blood (routine x 2)     Status: None (Preliminary result)   Collection Time: 11/25/15  2:34 PM  Result Value Ref Range Status   Specimen Description BLOOD LEFT FOREARM  Final   Special Requests BOTTLES DRAWN AEROBIC ONLY 5CC  Final   Culture NO GROWTH 4 DAYS  Final   Report Status PENDING   Incomplete  CSF culture     Status: None   Collection Time: 11/25/15  4:15 PM  Result Value Ref Range Status   Specimen Description CSF  Final   Special Requests NONE  Final   Gram Stain   Final    CYTOSPIN SMEAR WBC PRESENT, PREDOMINANTLY PMN NO ORGANISMS SEEN CONFIRMED BY V WILLIAMS    Culture NO GROWTH 3 DAYS  Final   Report Status 11/29/2015 FINAL  Final  MRSA PCR Screening     Status: None   Collection Time: 11/25/15 10:51 PM  Result Value Ref Range Status   MRSA by PCR NEGATIVE NEGATIVE Final    Comment:        The GeneXpert MRSA Assay (FDA approved for NASAL specimens only), is one component of a comprehensive MRSA colonization surveillance program. It is not intended to diagnose MRSA infection nor to guide or monitor treatment for MRSA infections.     Studies: No results found.   Scheduled Meds: . amantadine  100 mg Oral BID  . ARIPiprazole  10 mg Oral BID  . enoxaparin (LOVENOX) injection  40 mg Subcutaneous Q24H  . guaiFENesin  1,200 mg Oral BID  . LORazepam  2 mg Oral TID  . traZODone  100 mg Oral QHS   Continuous Infusions: . sodium chloride Stopped (11/30/15 0200)   PRN Meds: acetaminophen, benztropine, diphenhydrAMINE, LORazepam, ondansetron, senna-docusate  Time spent: 30 minutes  Author: Lynden Oxford, MD [facility] Pager: [phone] 11/30/2015 4:14 PM  If 7PM-7AM, please contact night-coverage at [email or web address], password [facility]

## 2015-12-01 LAB — COMPREHENSIVE METABOLIC PANEL
ALT: 39 U/L (ref 17–63)
ANION GAP: 9 (ref 5–15)
AST: 37 U/L (ref 15–41)
Albumin: 4.7 g/dL (ref 3.5–5.0)
Alkaline Phosphatase: 61 U/L (ref 38–126)
BILIRUBIN TOTAL: 1 mg/dL (ref 0.3–1.2)
BUN: 13 mg/dL (ref 6–20)
CALCIUM: 10.1 mg/dL (ref 8.9–10.3)
CO2: 27 mmol/L (ref 22–32)
CREATININE: 0.86 mg/dL (ref 0.61–1.24)
Chloride: 105 mmol/L (ref 101–111)
GFR calc Af Amer: >60 mL/min (ref >60)
GFR calc non Af Amer: >60 mL/min (ref >60)
GLUCOSE: 103 mg/dL — AB (ref 65–99)
Potassium: 3.8 mmol/L (ref 3.5–5.1)
SODIUM: 141 mmol/L (ref 135–145)
TOTAL PROTEIN: 8.4 g/dL — AB (ref 6.5–8.1)

## 2015-12-01 LAB — CK: CK TOTAL: 324 U/L (ref 49–397)

## 2015-12-01 LAB — MAGNESIUM: Magnesium: 2 mg/dL (ref 1.7–2.4)

## 2015-12-01 LAB — PHOSPHORUS: Phosphorus: 3.1 mg/dL (ref 2.5–4.6)

## 2015-12-01 MED ORDER — SENNOSIDES-DOCUSATE SODIUM 8.6-50 MG PO TABS: 1.0000 | ORAL_TABLET | Freq: Every evening | ORAL | Status: AC | PRN

## 2015-12-01 MED ORDER — LORAZEPAM 2 MG/ML IJ SOLN: 1.0000 mg | Freq: Once | INTRAMUSCULAR | Status: DC

## 2015-12-01 MED ORDER — ARIPIPRAZOLE 10 MG PO TABS: 10.0000 mg | ORAL_TABLET | Freq: Two times a day (BID) | ORAL | Status: AC

## 2015-12-01 MED ORDER — BENZTROPINE MESYLATE 0.5 MG PO TABS: 0.5000 mg | ORAL_TABLET | Freq: Two times a day (BID) | ORAL | Status: AC | PRN

## 2015-12-01 MED ORDER — DIPHENHYDRAMINE HCL 25 MG PO CAPS: 25.0000 mg | ORAL_CAPSULE | Freq: Four times a day (QID) | ORAL | Status: AC | PRN

## 2015-12-01 MED ORDER — LORAZEPAM 2 MG PO TABS: 2.0000 mg | ORAL_TABLET | Freq: Three times a day (TID) | ORAL | Status: AC

## 2015-12-01 NOTE — Clinical Social Work Note (Signed)
CSW provided rescind IVC paperwork for Md to sign as pt is not suicidal, homicidal, having hallucinations or delusional per Dr. Ronnette Hila note dated 12/01/15.    Marland KitchenElray Buba, LCSW [facility] Clinical Social Worker - Weekend Coverage cell #: [phone]

## 2015-12-01 NOTE — Consult Note (Signed)
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 Posey Pronto Patient Identification: Rodney Chandler MRN:  [id number] Principal Diagnosis: Rhabdomyolysis Diagnosis:   Patient Active Problem List   Diagnosis Date Noted  . Dystonia [G24.9]   . Non-traumatic rhabdomyolysis [M62.82]   . Schizoaffective disorder, bipolar type ([place]) [F25.0]   . Schizoaffective disorder ([place]) [F25.9] 11/28/2015  . Rhabdomyolysis [M62.82] 11/28/2015  . Abnormal creatine kinase level [R74.8]   . NMS (neuroleptic malignant syndrome) [G21.0] 11/25/2015  . Change in mental state [R41.82] 11/25/2015  . Homicidal ideation [R45.850]   . Mania ([place]) [F30.9]   . Bipolar affective disorder, current episode manic with psychotic symptoms ([place]) [F31.2] 09/17/2015  . Cannabis use disorder, severe, dependence ([place]) [F12.20] 09/17/2015  . Concussion with loss of consciousness <= 30 min [S06.0X1A] 09/17/2013  . Acute respiratory failure ([place]) [J96.00] 09/17/2013  . 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 Medication management [Z79.899] 08/31/2013    Total Time spent with patient: 30 minutes  Subjective:   Rodney Chandler is a 30 y.o. male admitted within 24 hours of AMA from [facility] hospital for neuroleptic malignant syndrome and his CK levels are trending up.   Interval history: Patient has been calm and cooperative and able to see his CK level which is trending down and asked to stay in hospital for further monitoring which patient disagree. Case discussed with Dr. Posey Pronto who said he has no medical indication to hold him in hospital as his levels are trending down. He asked to be discharged from hospital saying he is not suicidal or homicidal and has no evidence of hallucination, paranoid delusions. He also stated that he is feeling bored being in hospital several days and nothing to do while in his room. His  aunt visited him this morning. He becomes agitated and stated he does not want see the physician and does not want any help besides going home. He will discharge to home with with out patient care at [facility].   Risk to Self: Is patient at risk for suicide?: No Risk to Others:   Prior Inpatient Therapy:   Prior Outpatient Therapy:    Past Medical History:  Past Medical History  Diagnosis Date  . Bipolar 1 disorder ([place])   . Manic depression ([place])   . Schizophrenia ([place])   . Mandibular fracture ([place]) 09/17/2013    Past Surgical History  Procedure Laterality Date  . Orif mandibular fracture N/A 09/17/2013    Procedure: OPEN REDUCTION INTERNAL FIXATION (ORIF) MANDIBULAR FRACTURE;  Surgeon: Ascencion Dike, MD;  Location: [place];  Service: ENT;  Laterality: N/A;  . Mouth surgery    . Mandibular hardware removal N/A 10/18/2013    Procedure: MANDIBULAR HARDWARE REMOVAL;  Surgeon: Ascencion Dike, MD;  Location: [place];  Service: ENT;  Laterality: N/A;   Family History: History reviewed. No pertinent family history. Family Psychiatric  History: None Social History:  History  Alcohol Use  . Yes    Comment: Weekends.      History  Drug Use  . 14.00 per week  . Special: Marijuana    Comment: Last used: 3 hours ago    Social History   Social History  . Marital Status: Single    Spouse Name: N/A  . Number of Children: N/A  . Years of Education: N/A   Social History Main Topics  . Smoking status:  Former Smoker -- 0.50 packs/day    Types: Cigarettes  . Smokeless tobacco: Never Used  . Alcohol Use: Yes     Comment: Weekends.   . Drug Use: 14.00 per week    Special: Marijuana     Comment: Last used: 3 hours ago  . Sexual Activity: Yes   Other Topics Concern  . None   Social History Narrative   Additional Social History: Patient stated he is living with a friend and planning to lease an apartment  and by a car.     Allergies:   Allergies   Allergen Reactions  . Prolixin [Fluphenazine] Other (See Comments)    Slurred speech, foam at the mouth, spit   . Lithium Other (See Comments)    NOSEBLEED  . Depakote [Divalproex Sodium] Other (See Comments)    NOSEBLEED  . Zyprexa [Olanzapine] Other (See Comments)    NOSEBLEED    Labs:  Results for orders placed or performed during the hospital encounter of 11/28/15 (from the past 48 hour(s))  CK     Status: Abnormal   Collection Time: 11/29/15  3:47 PM  Result Value Ref Range   Total CK 731 (H) 49 - 397 U/L  Comprehensive metabolic panel     Status: None   Collection Time: 11/30/15  6:00 AM  Result Value Ref Range   Sodium 140 135 - 145 mmol/L   Potassium 3.9 3.5 - 5.1 mmol/L   Chloride 105 101 - 111 mmol/L   CO2 25 22 - 32 mmol/L   Glucose, Bld 97 65 - 99 mg/dL   BUN 10 6 - 20 mg/dL   Creatinine, Ser 0.90 0.61 - 1.24 mg/dL   Calcium 9.3 8.9 - 10.3 mg/dL   Total Protein 7.4 6.5 - 8.1 g/dL   Albumin 4.2 3.5 - 5.0 g/dL   AST 38 15 - 41 U/L   ALT 40 17 - 63 U/L   Alkaline Phosphatase 59 38 - 126 U/L   Total Bilirubin 1.2 0.3 - 1.2 mg/dL   GFR calc non Af Amer >60 >60 mL/min   GFR calc Af Amer >60 >60 mL/min    Comment: (NOTE) The eGFR has been calculated using the CKD EPI equation. This calculation has not been validated in all clinical situations. eGFR's persistently <60 mL/min signify possible Chronic Kidney Disease.    Anion gap 10 5 - 15  Magnesium     Status: None   Collection Time: 11/30/15  6:00 AM  Result Value Ref Range   Magnesium 1.9 1.7 - 2.4 mg/dL  Phosphorus     Status: None   Collection Time: 11/30/15  6:00 AM  Result Value Ref Range   Phosphorus 3.1 2.5 - 4.6 mg/dL  CK     Status: Abnormal   Collection Time: 11/30/15  6:00 AM  Result Value Ref Range   Total CK 476 (H) 49 - 397 U/L  Comprehensive metabolic panel     Status: Abnormal   Collection Time: 12/01/15  6:46 AM  Result Value Ref Range   Sodium 141 135 - 145 mmol/L   Potassium 3.8 3.5  - 5.1 mmol/L   Chloride 105 101 - 111 mmol/L   CO2 27 22 - 32 mmol/L   Glucose, Bld 103 (H) 65 - 99 mg/dL   BUN 13 6 - 20 mg/dL   Creatinine, Ser 0.86 0.61 - 1.24 mg/dL   Calcium 10.1 8.9 - 10.3 mg/dL   Total Protein 8.4 (H) 6.5 - 8.1 g/dL  Albumin 4.7 3.5 - 5.0 g/dL   AST 37 15 - 41 U/L   ALT 39 17 - 63 U/L   Alkaline Phosphatase 61 38 - 126 U/L   Total Bilirubin 1.0 0.3 - 1.2 mg/dL   GFR calc non Af Amer >60 >60 mL/min   GFR calc Af Amer >60 >60 mL/min    Comment: (NOTE) The eGFR has been calculated using the CKD EPI equation. This calculation has not been validated in all clinical situations. eGFR's persistently <60 mL/min signify possible Chronic Kidney Disease.    Anion gap 9 5 - 15  Magnesium     Status: None   Collection Time: 12/01/15  6:46 AM  Result Value Ref Range   Magnesium 2.0 1.7 - 2.4 mg/dL  Phosphorus     Status: None   Collection Time: 12/01/15  6:46 AM  Result Value Ref Range   Phosphorus 3.1 2.5 - 4.6 mg/dL  CK     Status: None   Collection Time: 12/01/15  6:46 AM  Result Value Ref Range   Total CK 324 49 - 397 U/L    Current Facility-Administered Medications  Medication Dose Route Frequency Provider Last Rate Last Dose  . 0.9 %  sodium chloride infusion   Intravenous Continuous Reyne Dumas, MD   Stopped at 11/30/15 0200  . acetaminophen (TYLENOL) tablet 650 mg  650 mg Oral Q4H PRN Reyne Dumas, MD   650 mg at 11/29/15 0152  . amantadine (SYMMETREL) capsule 100 mg  100 mg Oral BID Reyne Dumas, MD   100 mg at 12/01/15 0916  . ARIPiprazole (ABILIFY) tablet 10 mg  10 mg Oral BID Ambrose Finland, MD   10 mg at 12/01/15 0916  . benztropine (COGENTIN) tablet 0.5 mg  0.5 mg Oral BID PRN Ambrose Finland, MD   0.5 mg at 11/29/15 2006  . diphenhydrAMINE (BENADRYL) capsule 25 mg  25 mg Oral Q6H PRN Ambrose Finland, MD   25 mg at 11/29/15 2006  . enoxaparin (LOVENOX) injection 40 mg  40 mg Subcutaneous Q24H Reyne Dumas, MD   40 mg at  11/30/15 1002  . guaiFENesin (MUCINEX) 12 hr tablet 1,200 mg  1,200 mg Oral BID Rhetta Mura Schorr, NP   1,200 mg at 12/01/15 0916  . LORazepam (ATIVAN) injection 1 mg  1 mg Intravenous Once Lavina Hamman, MD      . LORazepam (ATIVAN) injection 2 mg  2 mg Intravenous Q6H PRN Reyne Dumas, MD   2 mg at 11/30/15 1848  . LORazepam (ATIVAN) tablet 2 mg  2 mg Oral TID Ambrose Finland, MD   2 mg at 12/01/15 0916  . ondansetron (ZOFRAN) injection 4 mg  4 mg Intravenous Q4H PRN Reyne Dumas, MD      . senna-docusate (Senokot-S) tablet 1 tablet  1 tablet Oral QHS PRN Reyne Dumas, MD      . traZODone (DESYREL) tablet 100 mg  100 mg Oral QHS Reyne Dumas, MD   100 mg at 11/29/15 2200    Musculoskeletal: Strength & Muscle Tone: stiff muscles Gait & Station: patient did not stand, not stable Patient leans: N/A  Psychiatric Specialty Exam: Review of Systems   Blood pressure 110/61, pulse 83, temperature 97.3 F (36.3 C), temperature source Oral, resp. rate 18, height '5\' 11"'$  (1.803 m), weight 64.139 kg (141 lb 6.4 oz), SpO2 100 %.Body mass index is 19.73 kg/(m^2).  General Appearance: Casual  Eye Contact::  Good  Speech:  Clear and Coherent  Volume:  Normal  Mood:  Anxious and Irritable  Affect:  Inappropriate and Labile  Thought Process:  Coherent and Goal Directed  Orientation:  Full (Time, Place, and Person)  Thought Content:  WDL  Suicidal Thoughts:  No  Homicidal Thoughts:  No  Memory:  intact  Judgement:  Fair  Insight:  Fair  Psychomotor Activity:  Normal  Concentration:  Fair  Recall:  Good  Fund of Knowledge:Fair  Language: Good  Akathisia:  Yes  Handed:  Right  AIMS (if indicated):     Assets:  Housing Leisure Time Resilience Social Support  ADL's:  Intact  Cognition: Impaired,  Severe  Sleep:      Treatment Plan Summary: Daily contact with patient to assess and evaluate symptoms and progress in treatment, Medication management and Plan neuroleptic malignant  syndrome:   Patient has been free from Neuroleptic malignant syndrome  He has no indication to stay in medical hospital Recommend rescind IVC Discontinue safety sitter Continue Abilify 10 mg PO BID schizoaffective Continue Cogentin 0.5 mg PO BID/PRN for EPS Change Ativan 2 mg PO TID /PRN for agitation Discontinued Prolixin and Haldol due to NMS Appreciate psychiatric consultation and we will sign off as he will be discharged to home Please contact [phone] or [phone] if needs further assistance   Disposition: Refer to ACT services and [facility] for medication management when medically stable Patient does not meet criteria for psychiatric inpatient admission. Supportive therapy provided about ongoing stressors.  Durward Parcel., MD 12/01/2015 1:17 PM

## 2015-12-01 NOTE — Progress Notes (Signed)
Pt discharged to home. DC instructions given. Prescription x 1 given for ativan. No concerns voiced. Pt reminded to stop by pharmacy to pick up rest of medications that had been e-prescribed by MD. Voiced understanding. Pt left unit in wheelchair pushed by nurse tech. Left in good condition. Pt extremely excited to go home. Reminded pt of importance to following up with providers as stated in DC summary. Pt voiced understanding. Key to pt's belongings given to nurse tech to stop by the security office for pt to obtain belongings that were left in locker. VWilliams,rn.

## 2015-12-01 NOTE — Discharge Summary (Signed)
[facility] Discharge Summary   Patient: Rodney Chandler ZOX:[id number]   PCP: [facility] DOB: [birth year]   Date of admission: 11/28/2015   Date of discharge: 12/01/2015    Discharge Diagnoses:  Principal Problem:   Rhabdomyolysis Active Problems:   Bipolar affective disorder, current episode manic with psychotic symptoms (HCC)   Cannabis use disorder, severe, dependence (HCC)   Mania (HCC)   NMS (neuroleptic malignant syndrome)   Schizoaffective disorder (HCC)   Dystonia   Non-traumatic rhabdomyolysis   Schizoaffective disorder, bipolar type (HCC)   Recommendations for Outpatient Follow-up:  1. Please [facility]  2. Follow up with PCP in 1 week  Follow-up Information    Follow up with [facility].   Why:  This is your assigned Medicaid [place] access doctor If you prefer another contact DSS [phone] DSS assigned your doctor *You may receive a bill if you go to any family Dr not assigned to you   Contact information:   [address] [place] [address] [phone]       Follow up with Medicaid [place] Access Covered Patient .   [phone] [address] [place], [place] [address] [email or web address] Use this website to assist with understanding your coverage & to renew application   Contact information:   As a Medicaid client you MUST contact DSS/SSI each time you change address, move to another [place] county or another state to keep your address updated  [place] Medicaid Transportation to Dr appts if you are have full Medicaid: [phone], [id number]      Follow up with [facility]. Call in 2 days.   Specialty:  [facility] information:   [address] [place] [address] [phone]       Follow up with [facility]. Schedule an appointment as soon as possible for a visit in 1 week.   Contact information:   [address] [place] [address] [phone]       Diet recommendation: regular diet  Activity: The patient is advised to gradually reintroduce usual activities.  Discharge Condition: fair  History of present illness: As per the H and P dictated on admission, "30 yo bipolar schizophrenic recently started on Prolixin and seen in [facility] ED 2/11 for suspected tardive dyskinesia and treated with ativan and he walked out of ER. Came back on 2/12 with decreased LOC, dull affect, fever increased drooling, mild nuchal rigidity. In the setting of elevated CK of 255 and white count of 11.5 and a fever of 101.1, the possibility of CNS infection was raised. Patient started on empiric antibiotics. Patient had LP on 11/25/15 which was negative. Neurology saw the patient on 2/12 and recommended Dantrolene, if the patient continued to deteriorate further. He was transferred to [facility] 2/12 because of need for Neuro input. Subsequently admitted to ICU, CT of head did not show any intracranial mass. Patient moved to MedSurg unit on 2/13 from where he left AGAINST MEDICAL ADVICE. Patient does not provide much history, noted to have abnormal facial movements which were felt to represent tardive dyskinesia.  Patient's HIV screen was found to be nonreactive, UDS positive for marijuana, CK 2163, pro calcitonin ordered and pending, patient will be transferred to [facility] for neurology evaluation. Patient is currently under [facility]"  Hospital Course:  Summary of his active problems in the hospital is as following. 1. Rhabdomyolysis Neuroleptic malignant  syndrome. Schizoaffective disorder with bipolar disorder. Substance abuse with marijuana. Mania on presentation.  Patient recently admitted for neuroleptic malignant syndrome and left AMA. He came back to the hospital with worsening back pain as well as worsening CK level. His temperature on admission remain elevated. Patient was admitted to the hospital again for neuroleptic malignant  syndrome. His home medications were discontinued. A safety sitter was placed due to patient's agitation on admission with mania. Last admission psychiatric recommended to consider IVC for the patient due to his lack of insight and therefore the patient was IVC on current admission  Appreciate input from neurology as well as psychiaty.   Psychiatry initially after evaluating the patient recommended to continue IVC, continue bedside sitter, and patient's medication were adjusted for agitation with concerns regarding mania. Repeat CK has improved and neurology at present recommends that patient is getting better as far as neuroleptic malignant syndrome is concerned. Discussed with psychiatry and they recommended that the patient can be discharged home safely without any concerned on current medication. Also discuss with patient's mother who mentioned that she will be maintaining contact with the pt to ensure that he takes medications. All other chronic medical condition were stable during the hospitalization.  Patient was ambulatory without any assistance. On the day of the discharge the patient was cleared by psychiatry and CK level normalized, and no other acute medical condition were reported by patient. the patient was felt safe to be discharge at home with family.  Procedures and Results:  none   Consultations:  Neurology, psychiatry   DISCHARGE MEDICATION: Discharge Medication List as of 12/01/2015  3:23 PM    START taking these medications   Details  ARIPiprazole (ABILIFY) 10 MG tablet Take 1 tablet (10 mg total) by mouth 2 (two) times daily., Starting 12/01/2015, Until Discontinued, Normal    benztropine (COGENTIN) 0.5 MG tablet Take 1 tablet (0.5 mg total) by mouth 2 (two) times daily as needed for tremors., Starting 12/01/2015, Until Discontinued, Normal    diphenhydrAMINE (BENADRYL) 25 mg capsule Take 1 capsule (25 mg total) by mouth every 6 (six) hours as needed for itching  (agitation)., Starting 12/01/2015, Until Discontinued, Normal    senna-docusate (SENOKOT-S) 8.6-50 MG tablet Take 1 tablet by mouth at bedtime as needed for mild constipation., Starting 12/01/2015, Until Discontinued, Normal      CONTINUE these medications which have CHANGED   Details  LORazepam (ATIVAN) 2 MG tablet Take 1 tablet (2 mg total) by mouth 3 (three) times daily., Starting 12/01/2015, Until Discontinued, Print      CONTINUE these medications which have NOT CHANGED   Details  traZODone (DESYREL) 100 MG tablet Take 1 tablet (100 mg total) by mouth at bedtime., Starting 09/20/2015, Until Discontinued, Print    amantadine (SYMMETREL) 100 MG capsule Take 1 capsule (100 mg total) by mouth 2 (two) times daily., Starting 09/20/2015, Until Discontinued, Print      STOP taking these medications     diphenhydramine-acetaminophen (TYLENOL PM) 25-500 MG TABS tablet      fluPHENAZine (PROLIXIN) 10 MG tablet      fluPHENAZine (PROLIXIN) 5 MG tablet      haloperidol (HALDOL) 5 MG tablet      Oxcarbazepine (TRILEPTAL) 300 MG tablet        Allergies  Allergen Reactions  . Prolixin [Fluphenazine] Other (See Comments)    Slurred speech, foam at the mouth, spit   . Lithium Other (See Comments)    NOSEBLEED  . Depakote [Divalproex Sodium]  Other (See Comments)    NOSEBLEED  . Zyprexa [Olanzapine] Other (See Comments)    NOSEBLEED   Discharge Instructions    Diet general    Complete by:  As directed      Discharge instructions    Complete by:  As directed   It is important that you read following instructions as well as go over your medication list with RN to help you understand your care after this hospitalization.  Discharge Instructions: Please follow-up with PCP in one week. Please call [facility] for follow up asap.  Please request your primary care physician to go over all Hospital Tests and Procedure/Radiological results at the follow up,  Please get all Hospital records sent  to your PCP by signing hospital release before you go home.   Do not drive, operating heavy machinery, perform activities at heights, swimming or participation in water activities or provide baby sitting services while your are on Pain, Sleep and Anxiety Medications; until you have been seen by Primary Care Physician or a Neurologist and advised to do so again. Do not take more than prescribed Pain, Sleep and Anxiety Medications. You were cared for by a hospitalist during your hospital stay. If you have any questions about your discharge medications or the care you received while you were in the hospital after you are discharged, you can call the unit and ask to speak with the hospitalist on call if the hospitalist that took care of you is not available.  Once you are discharged, your primary care physician will handle any further medical issues. Please note that NO REFILLS for any discharge medications will be authorized once you are discharged, as it is imperative that you return to your primary care physician (or establish a relationship with a primary care physician if you do not have one) for your aftercare needs so that they can reassess your need for medications and monitor your lab values. You Must read complete instructions/literature along with all the possible adverse reactions/side effects for all the Medicines you take and that have been prescribed to you. Take any new Medicines after you have completely understood and accept all the possible adverse reactions/side effects. Wear Seat belts while driving. If you have smoked or chewed Tobacco in the last 2 yrs please stop smoking and/or stop any Recreational drug use.     Increase activity slowly    Complete by:  As directed           Discharge Exam: Filed Weights   11/30/15 0523  Weight: 64.139 kg (141 lb 6.4 oz)   Filed Vitals:   11/30/15 2104 12/01/15 0510  BP: 118/84 110/61  Pulse: 62 83  Temp: 98.2 F (36.8 C) 97.3 F (36.3  C)  Resp: 17 18   General: Appear in mild distress, no Rash; Oral Mucosa moist. Cardiovascular: S1 and S2 Present, no Murmur, no JVD Respiratory: Bilateral Air entry present and Clear to Auscultation, no Crackles, no wheezes Abdomen: Bowel Sound present, Soft and no tenderness Extremities: no Pedal edema, no calf tenderness Neurology: Grossly no focal neuro deficit.  The results of significant diagnostics from this hospitalization (including imaging, microbiology, ancillary and laboratory) are listed below for reference.    Significant Diagnostic Studies: Dg Chest 2 View  11/28/2015  CLINICAL DATA:  Productive cough. EXAM: CHEST  2 VIEW COMPARISON:  11/25/2015 FINDINGS: Prominent peribronchial thickening. The lungs are otherwise clear. Heart size and vascularity are normal. Minimal thoracic scoliosis. IMPRESSION: Bronchitic changes. Electronically Signed  By: Francene Boyers M.D.   On: 11/28/2015 15:10   Dg Chest 2 View  11/25/2015  CLINICAL DATA:  30 year old male with acute chest pain following seizure. EXAM: CHEST  2 VIEW COMPARISON:  04/26/2015 chest radiograph FINDINGS: The cardiomediastinal silhouette is unremarkable. There is no evidence of focal airspace disease, pulmonary edema, suspicious pulmonary nodule/mass, pleural effusion, or pneumothorax. No acute bony abnormalities are identified. IMPRESSION: No active cardiopulmonary disease. Electronically Signed   By: Harmon Pier M.D.   On: 11/25/2015 11:25   Dg Pelvis 1-2 Views  11/25/2015  CLINICAL DATA:  Pain following seizure EXAM: PELVIS - 1-2 VIEW COMPARISON:  None. FINDINGS: There is no evidence of pelvic fracture or dislocation. Joint spaces appear intact. Sacroiliac joints appear normal bilaterally. IMPRESSION: No fracture or dislocation.  No appreciable arthropathy. Electronically Signed   By: Bretta Bang III M.D.   On: 11/25/2015 07:56   Ct Head Wo Contrast  11/25/2015  CLINICAL DATA:  Seizure this morning with altered  mental status EXAM: CT HEAD WITHOUT CONTRAST TECHNIQUE: Contiguous axial images were obtained from the base of the skull through the vertex without intravenous contrast. COMPARISON:  September 17, 2013 FINDINGS: The ventricles are normal in size and configuration. There is no intracranial mass, hemorrhage, extra-axial fluid collection, or midline shift. The gray-white compartments appear normal. No acute infarct evident. The bony calvarium appears intact. The mastoid air cells are clear. No intraorbital lesions are identified. There is leftward deviation of the nasal septum. There is right nasal turbinate edema. IMPRESSION: Right nasal turbinate edema with narrowing of the right naris. Leftward deviation the nasal septum. No intracranial mass, hemorrhage, or extra-axial fluid collection. Gray-white compartments appear normal. Electronically Signed   By: Bretta Bang III M.D.   On: 11/25/2015 15:41    Microbiology: Recent Results (from the past 240 hour(s))  Culture, blood (Routine X 2) w Reflex to ID Panel     Status: None   Collection Time: 11/25/15 11:30 AM  Result Value Ref Range Status   Specimen Description BLOOD RIGHT ANTECUBITAL  Final   Special Requests BOTTLES DRAWN AEROBIC AND ANAEROBIC 10CC  Final   Culture   Final    NO GROWTH 5 DAYS Performed at [facility]    Report Status 11/30/2015 FINAL  Final  Culture, blood (Routine X 2) w Reflex to ID Panel     Status: None   Collection Time: 11/25/15 11:30 AM  Result Value Ref Range Status   Specimen Description BLOOD RIGHT ANTECUBITAL  Final   Special Requests IN PEDIATRIC BOTTLE 2.5CC  Final   Culture   Final    NO GROWTH 5 DAYS Performed at [facility]    Report Status 11/30/2015 FINAL  Final  Culture, blood (routine x 2)     Status: None   Collection Time: 11/25/15  2:25 PM  Result Value Ref Range Status   Specimen Description BLOOD LEFT ANTECUBITAL  Final   Special Requests BOTTLES DRAWN AEROBIC AND ANAEROBIC  5CC  Final   Culture NO GROWTH 5 DAYS  Final   Report Status 11/30/2015 FINAL  Final  Culture, blood (routine x 2)     Status: None   Collection Time: 11/25/15  2:34 PM  Result Value Ref Range Status   Specimen Description BLOOD LEFT FOREARM  Final   Special Requests BOTTLES DRAWN AEROBIC ONLY 5CC  Final   Culture NO GROWTH 5 DAYS  Final   Report Status 11/30/2015 FINAL  Final  CSF  culture     Status: None   Collection Time: 11/25/15  4:15 PM  Result Value Ref Range Status   Specimen Description CSF  Final   Special Requests NONE  Final   Gram Stain   Final    CYTOSPIN SMEAR WBC PRESENT, PREDOMINANTLY PMN NO ORGANISMS SEEN CONFIRMED BY V WILLIAMS    Culture NO GROWTH 3 DAYS  Final   Report Status 11/29/2015 FINAL  Final  MRSA PCR Screening     Status: None   Collection Time: 11/25/15 10:51 PM  Result Value Ref Range Status   MRSA by PCR NEGATIVE NEGATIVE Final    Comment:        The GeneXpert MRSA Assay (FDA approved for NASAL specimens only), is one component of a comprehensive MRSA colonization surveillance program. It is not intended to diagnose MRSA infection nor to guide or monitor treatment for MRSA infections.      Labs: CBC:  Recent Labs Lab 11/25/15 0730 11/25/15 1425 11/26/15 0415 11/27/15 0635 11/28/15 0338 11/28/15 0800  WBC 11.5* 8.8 8.2 9.1 10.7* 9.8  NEUTROABS 8.8* 5.7  --  5.7  --   --   HGB 13.2 13.2 13.1 14.0 13.2 13.1  HCT 40.0 40.2 39.6 41.1 39.2 39.6  MCV 97.8 97.6 96.6 97.2 97.8 97.8  PLT 269 239 236 239 258 220   Basic Metabolic Panel:  Recent Labs Lab 11/26/15 0415 11/27/15 0635 11/28/15 0338 11/28/15 0800 11/30/15 0600 12/01/15 0646  NA 138 141 140  --  140 141  K 3.7 3.7 4.1  --  3.9 3.8  CL 101 104 107  --  105 105  CO2 --  25 27  GLUCOSE 123* 108* 109*  --  97 103*  BUN --  10 13  CREATININE 0.93 0.97 0.69 0.71 0.90 0.86  CALCIUM 9.0 9.1 9.3  --  9.3 10.1  MG 1.8 1.9  --   --  1.9 2.0  PHOS 3.9   --   --   --  3.1 3.1   Liver Function Tests:  Recent Labs Lab 11/25/15 0730 11/25/15 1425 11/28/15 0338 11/30/15 0600 12/01/15 0646  AST 47* 43* 82* 38 37  ALT 74* 69* 57 40 39  ALKPHOS 67 61 67 59 61  BILITOT 0.9 0.7 0.6 1.2 1.0  PROT 7.6 6.8 7.6 7.4 8.4*  ALBUMIN 4.4 3.7 4.3 4.2 4.7   No results for input(s): LIPASE, AMYLASE in the last 168 hours. No results for input(s): AMMONIA in the last 168 hours. Cardiac Enzymes:  Recent Labs Lab 11/27/15 0635 11/28/15 0338 11/28/15 1741 11/29/15 1547 11/30/15 0600 12/01/15 0646  CKTOTAL 1883* 2163* 1538* 731* 476* 324  CKMB 3.4  --   --   --   --   --    BNP (last 3 results) No results for input(s): BNP in the last 8760 hours. CBG:  Recent Labs Lab 11/25/15 1545 11/25/15 2056 11/26/15 0012 11/26/15 0350 11/26/15 0814  GLUCAP 90 121* 102* 73 77   Time spent: 30 minutes  Signed:  Han Lysne  [facility] 12/01/2015, 9:23 PM

## 2015-12-01 NOTE — Clinical Social Work Note (Signed)
CSW met with pt to provide discharge plans.  CSW prompted pt to discuss needs and services in his community.  Pt stated that he spoke with [facility] and they are providing him with ACT services, are going to help him get back into school and will also help with housing.  CSW asked pt if he needed any additional help or services and he stated that he was already "hooked up" and did not have any other needs.  Dede Query, LCSW [facility] - Weekend Coverage cell #: [phone]

## 2015-12-31 ENCOUNTER — Emergency Department (HOSPITAL_COMMUNITY)
Admission: EM | Admit: 2015-12-31 | Discharge: 2015-12-31 | Disposition: A | Discharge status: Home / Self Care | Payer: Medicaid Other | Attending: Emergency Medicine | Admitting: Emergency Medicine

## 2015-12-31 ENCOUNTER — Encounter (HOSPITAL_COMMUNITY): Payer: Self-pay | Admitting: Emergency Medicine

## 2015-12-31 DIAGNOSIS — F17210 Nicotine dependence, cigarettes, uncomplicated: Secondary | ICD-10-CM | POA: Insufficient documentation

## 2015-12-31 DIAGNOSIS — Z760 Encounter for issue of repeat prescription: Secondary | ICD-10-CM | POA: Diagnosis present

## 2015-12-31 NOTE — ED Notes (Signed)
Patient requesting medication refill for three medications. Patient thinks one is a 10mg  ativan and he is unsure what the others are. Patient states [facility] and was told he couldn't get them filled there without filling out more paperwork which he did not want to do. Patient denies SI, HI, denies hallucinations.

## 2018-06-29 ENCOUNTER — Encounter (HOSPITAL_COMMUNITY): Payer: Self-pay | Admitting: Emergency Medicine

## 2018-06-29 ENCOUNTER — Emergency Department (HOSPITAL_COMMUNITY)
Admission: EM | Admit: 2018-06-29 | Discharge: 2018-06-29 | Disposition: A | Discharge status: Home / Self Care | Payer: Self-pay | Attending: Emergency Medicine | Admitting: Emergency Medicine

## 2018-06-29 ENCOUNTER — Other Ambulatory Visit: Payer: Self-pay

## 2018-06-29 DIAGNOSIS — M542 Cervicalgia: Secondary | ICD-10-CM | POA: Insufficient documentation

## 2018-06-29 DIAGNOSIS — Z5321 Procedure and treatment not carried out due to patient leaving prior to being seen by health care provider: Secondary | ICD-10-CM | POA: Insufficient documentation

## 2018-06-29 NOTE — ED Notes (Signed)
Pt called for a room with no answer x3. 
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
## 2018-06-29 NOTE — ED Notes (Signed)
Called for room X3, no answer. Pt not visualized in waiting room.

## 2019-08-29 ENCOUNTER — Other Ambulatory Visit: Payer: Self-pay

## 2019-08-29 ENCOUNTER — Ambulatory Visit (HOSPITAL_COMMUNITY)
Admission: EM | Admit: 2019-08-29 | Discharge: 2019-08-29 | Disposition: A | Discharge status: Home / Self Care | Payer: Self-pay | Attending: Internal Medicine | Admitting: Internal Medicine

## 2019-08-29 ENCOUNTER — Encounter (HOSPITAL_COMMUNITY): Payer: Self-pay

## 2019-08-29 DIAGNOSIS — S161XXA Strain of muscle, fascia and tendon at neck level, initial encounter: Secondary | ICD-10-CM

## 2019-08-29 DIAGNOSIS — S39012A Strain of muscle, fascia and tendon of lower back, initial encounter: Secondary | ICD-10-CM

## 2019-08-29 MED ORDER — CYCLOBENZAPRINE HCL 5 MG PO TABS: 5.0000 mg | ORAL_TABLET | Freq: Two times a day (BID) | ORAL | 0 refills | Status: AC | PRN

## 2019-08-29 MED ORDER — IBUPROFEN 800 MG PO TABS: 800.0000 mg | ORAL_TABLET | Freq: Three times a day (TID) | ORAL | 0 refills | Status: AC

## 2019-08-29 NOTE — Discharge Instructions (Addendum)
Use anti-inflammatories for pain/swelling. You may take up to 800 mg Ibuprofen every 8 hours with food. You may supplement Ibuprofen with Tylenol [phone] mg every 8 hours.   You may use flexeril as needed to help with pain. This is a muscle relaxer and causes sedation- please use only at bedtime or when you will be home and not have to drive/work  Alternate Ice and heat Gentle stretching of neck and back  Follow up if not improving or worsening
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
## 2019-10-23 ENCOUNTER — Encounter (HOSPITAL_COMMUNITY): Payer: Self-pay | Admitting: Emergency Medicine

## 2019-10-23 ENCOUNTER — Other Ambulatory Visit: Payer: Self-pay

## 2019-10-23 ENCOUNTER — Emergency Department (HOSPITAL_COMMUNITY)
Admission: EM | Admit: 2019-10-23 | Discharge: 2019-10-23 | Disposition: A | Discharge status: Home / Self Care | Payer: Self-pay | Attending: Emergency Medicine | Admitting: Emergency Medicine

## 2019-10-23 ENCOUNTER — Emergency Department (HOSPITAL_COMMUNITY): Payer: Self-pay

## 2019-10-23 DIAGNOSIS — M7122 Synovial cyst of popliteal space [Baker], left knee: Secondary | ICD-10-CM | POA: Insufficient documentation

## 2019-10-23 DIAGNOSIS — M25462 Effusion, left knee: Secondary | ICD-10-CM | POA: Insufficient documentation

## 2019-10-23 DIAGNOSIS — F17210 Nicotine dependence, cigarettes, uncomplicated: Secondary | ICD-10-CM | POA: Insufficient documentation

## 2019-10-23 DIAGNOSIS — Z79899 Other long term (current) drug therapy: Secondary | ICD-10-CM | POA: Insufficient documentation

## 2019-10-23 MED ORDER — NAPROXEN 500 MG PO TABS: 500.0000 mg | ORAL_TABLET | Freq: Two times a day (BID) | ORAL | 0 refills | Status: AC

## 2019-10-23 NOTE — ED Notes (Signed)
Patient Alert and oriented to baseline. Stable and ambulatory to baseline. Patient verbalized understanding of the discharge instructions.  Patient belongings were taken by the patient.   

## 2019-10-23 NOTE — ED Provider Notes (Signed)
[facility] EMERGENCY DEPARTMENT Provider Note   CSN: [id number] Arrival date & time: 10/23/19  0076     History Chief Complaint  Patient presents with  . Knee Pain    Rodney Chandler. is a 34 y.o. male with no relevant PMH who presents to the ED with a 1 month history of progressively worsening left knee swelling with no obvious traumatic etiology.  Patient states that he lifts and exercises regularly and is able to perform one press and squats, but feels as though there is fluid collecting around his left knee.  He also feels as though there is a knot in the popliteal fossa.  Interestingly, he states that prior to this he had similar swelling in his ankles.  He denies any recent illness, fevers or chills, penile discharge, testicular swelling or discomfort, IVDA, redness or warmth, or other symptoms.  HPI     Past Medical History:  Diagnosis Date  . Bipolar 1 disorder (HCC)   . Mandibular fracture (HCC) 09/17/2013  . Manic depression (HCC)   . Schizophrenia [facility])     Patient Active Problem List   Diagnosis Date Noted  . Dystonia   . Non-traumatic rhabdomyolysis   . Schizoaffective disorder, bipolar type (HCC)   . Schizoaffective disorder (HCC) 11/28/2015  . Rhabdomyolysis 11/28/2015  . Abnormal creatine kinase level   . NMS (neuroleptic malignant syndrome) 11/25/2015  . Change in mental state 11/25/2015  . Homicidal ideation   . Mania (HCC)   . Bipolar affective disorder, current episode manic with psychotic symptoms (HCC) 09/17/2015  . Cannabis use disorder, severe, dependence (HCC) 09/17/2015  . Concussion with loss of consciousness <= 30 min 09/17/2013  . Acute respiratory failure (HCC) 09/17/2013  . 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 Medication management 08/31/2013    Past Surgical History:  Procedure Laterality Date  . MANDIBULAR HARDWARE REMOVAL N/A 10/18/2013   Procedure: MANDIBULAR HARDWARE REMOVAL;  Surgeon: Darletta Moll, MD;   Location: [facility];  Service: ENT;  Laterality: N/A;  . MOUTH SURGERY    . ORIF MANDIBULAR FRACTURE N/A 09/17/2013   Procedure: OPEN REDUCTION INTERNAL FIXATION (ORIF) MANDIBULAR FRACTURE;  Surgeon: Darletta Moll, MD;  Location: [facility] OR;  Service: ENT;  Laterality: N/A;       Family History  Problem Relation Age of Onset  . Hypertension Mother   . Healthy Father     Social History   Tobacco Use  . Smoking status: Current Every Day Smoker    Packs/day: 0.50    Types: Cigarettes  . Smokeless tobacco: Never Used  Substance Use Topics  . Alcohol use: Not Currently    Comment: Weekends. , denies 12/31/2015  . Drug use: Yes    Frequency: 7.0 times per week    Types: Marijuana    Comment: smells strongly of marijuana    Home Medications Prior to Admission medications   Medication Sig Start Date End Date Taking? Authorizing Provider  ARIPiprazole (ABILIFY) 10 MG tablet Take 1 tablet (10 mg total) by mouth 2 (two) times daily. 12/01/15   Rolly Salter, MD  benztropine (COGENTIN) 0.5 MG tablet Take 1 tablet (0.5 mg total) by mouth 2 (two) times daily as needed for tremors. 12/01/15   Rolly Salter, MD  cyclobenzaprine (FLEXERIL) 5 MG tablet Take 1-2 tablets (5-10 mg total) by mouth 2 (two) times daily as needed for muscle spasms. 08/29/19   Wieters, Hallie C, PA-C  diphenhydrAMINE (BENADRYL) 25 mg  capsule Take 1 capsule (25 mg total) by mouth every 6 (six) hours as needed for itching (agitation). 12/01/15   Rolly Salter, MD  ibuprofen (ADVIL) 800 MG tablet Take 1 tablet (800 mg total) by mouth 3 (three) times daily. 08/29/19   Wieters, Hallie C, PA-C  LORazepam (ATIVAN) 2 MG tablet Take 1 tablet (2 mg total) by mouth 3 (three) times daily. 12/01/15   Rolly Salter, MD  naproxen (NAPROSYN) 500 MG tablet Take 1 tablet (500 mg total) by mouth 2 (two) times daily. 10/23/19   Lorelee New, PA-C  senna-docusate (SENOKOT-S) 8.6-50 MG tablet Take 1 tablet by mouth at bedtime  as needed for mild constipation. 12/01/15   Rolly Salter, MD  traZODone (DESYREL) 100 MG tablet Take 1 tablet (100 mg total) by mouth at bedtime. 09/20/15   Earney Navy, NP  amantadine (SYMMETREL) 100 MG capsule Take 1 capsule (100 mg total) by mouth 2 (two) times daily. Patient not taking: Reported on 10/29/2015 09/20/15 08/29/19  Earney Navy, NP    Allergies    Prolixin [fluphenazine], Lithium, Depakote [divalproex sodium], and Zyprexa [olanzapine]  Review of Systems   Review of Systems  Constitutional: Negative for chills and fever.  Musculoskeletal: Positive for arthralgias, gait problem and joint swelling.  Skin: Negative for color change and wound.    Physical Exam Updated Vital Signs BP 122/80   Pulse 72   Temp 98.3 F (36.8 C) (Oral)   Resp 14   SpO2 100%   Physical Exam Vitals and nursing note reviewed. Exam conducted with a chaperone present.  Constitutional:      Appearance: Normal appearance. He is not ill-appearing.  HENT:     Head: Normocephalic and atraumatic.  Eyes:     General: No scleral icterus.    Conjunctiva/sclera: Conjunctivae normal.  Cardiovascular:     Rate and Rhythm: Normal rate and regular rhythm.     Pulses: Normal pulses.     Heart sounds: Normal heart sounds.  Pulmonary:     Effort: Pulmonary effort is normal.     Breath sounds: Normal breath sounds.  Musculoskeletal:     Cervical back: Normal range of motion and neck supple. No rigidity.     Comments: Left knee: ROM and strength intact. Flexion mildly limited due to the inflammation and swelling. Mild bulge appreciated in popliteal fossa, more pronounced with dorsiflexion.  No overlying erythema or warmth.  Distal pulses and sensation intact.   Skin:    General: Skin is dry.  Neurological:     Mental Status: He is alert.     GCS: GCS eye subscore is 4. GCS verbal subscore is 5. GCS motor subscore is 6.  Psychiatric:        Mood and Affect: Mood normal.        Behavior:  Behavior normal.        Thought Content: Thought content normal.      ED Results / Procedures / Treatments   Labs (all labs ordered are listed, but only abnormal results are displayed) Labs Reviewed - No data to display  EKG None  Radiology DG Knee 2 Views Left  Result Date: 10/23/2019 CLINICAL DATA:  Left knee pain and swelling since November 2020. No known injury. EXAM: LEFT KNEE - 1-2 VIEW COMPARISON:  None. FINDINGS: There is no fracture or dislocation. However, there is a prominent joint effusion. Soft tissue prominence posteriorly most likely represents a Baker's cyst. No arthritic changes. IMPRESSION: Prominent  joint effusion. Soft tissue prominence posteriorly most likely represents a Baker's cyst. Otherwise, normal exam. No visible arthritic changes. Electronically Signed   By: Lorriane Shire M.D.   On: 10/23/2019 10:43    Procedures Procedures (including critical care time)  Medications Ordered in ED Medications - No data to display  ED Course  I have reviewed the triage vital signs and the nursing notes.  Pertinent labs & imaging results that were available during my care of the patient were reviewed by me and considered in my medical decision making (see chart for details).    MDM Rules/Calculators/A&P                      Patient presents with a 4-week history of atraumatic left knee swelling and discomfort.  DG left knee demonstrates findings consistent with a Baker's cyst in addition to a prominent joint effusion.  Low suspicion for septic arthritis at this time.  Patient denies any fevers or chills.  He also denies any penile discharge or testicular discomfort or swelling concerning for gonococcal septic arthritis.  No history of gout.  Discussed case with Dr. Sherry Ruffing. Do not feel as though tapping joint is warranted at this time instead may actually introduce new infection.  Instead, will apply knee sleeve and provide crutches.  Also prescribe course of  anti-inflammatory medications.  Will refer to orthopedics for ongoing evaluation management.  Encourage patient to return to the ED or seek medical attention immediately should he develop any redness, fevers or chills, inability to flex or extend joint, or any other new or worsening symptoms.  Discussed findings with patient as well as the plan.  He voiced understanding and is agreeable.   Final Clinical Impression(s) / ED Diagnoses Final diagnoses:  Baker's cyst of knee, left  Effusion of left knee    Rx / DC Orders ED Discharge Orders         Ordered    naproxen (NAPROSYN) 500 MG tablet  2 times daily     10/23/19 1110           Reita Chard 10/23/19 1110    Tegeler, Gwenyth Allegra, MD 10/23/19 [phone]

## 2019-10-23 NOTE — ED Triage Notes (Signed)
C/o L knee pain and swelling.  States it feels like a knot to posterior L knee.  Denies injury.

## 2019-10-23 NOTE — ED Notes (Signed)
Pt returned from xray

## 2019-10-23 NOTE — ED Notes (Signed)
Patient transported to X-ray 

## 2019-10-23 NOTE — Discharge Instructions (Signed)
Please read the attachment on Baker cysts  Please take your medications, as prescribed.  Follow-up with Duwayne Heck MD [facility] for ongoing evaluation and management of your Baker's cyst and joint effusion.  Return to the ED or seek medical attention should you develop any redness, fevers or chills, inability to flex or extend joint, or any other new or worsening symptoms.

## 2020-02-21 IMAGING — CR DG KNEE 1-2V*L*
2 series · 2 of 2 positions shown · non-contrast
Comparison: None.

CLINICAL DATA: Left knee pain and swelling since August 2019. No
known injury.

EXAM:
LEFT KNEE - 1-2 VIEW

[knee ap]
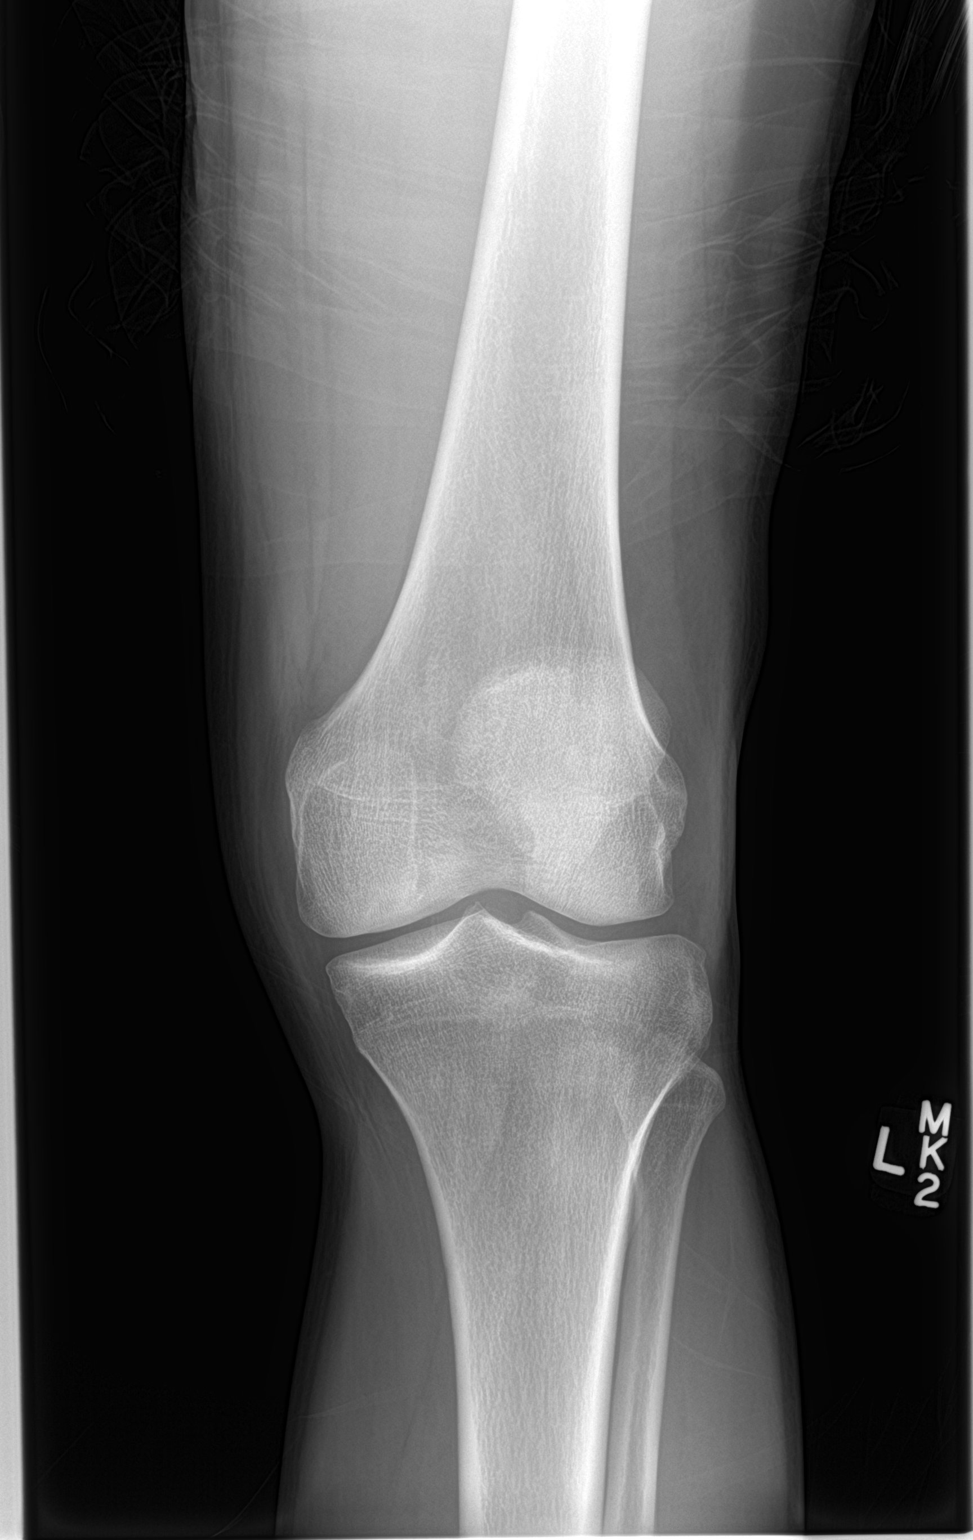

[knee lat]
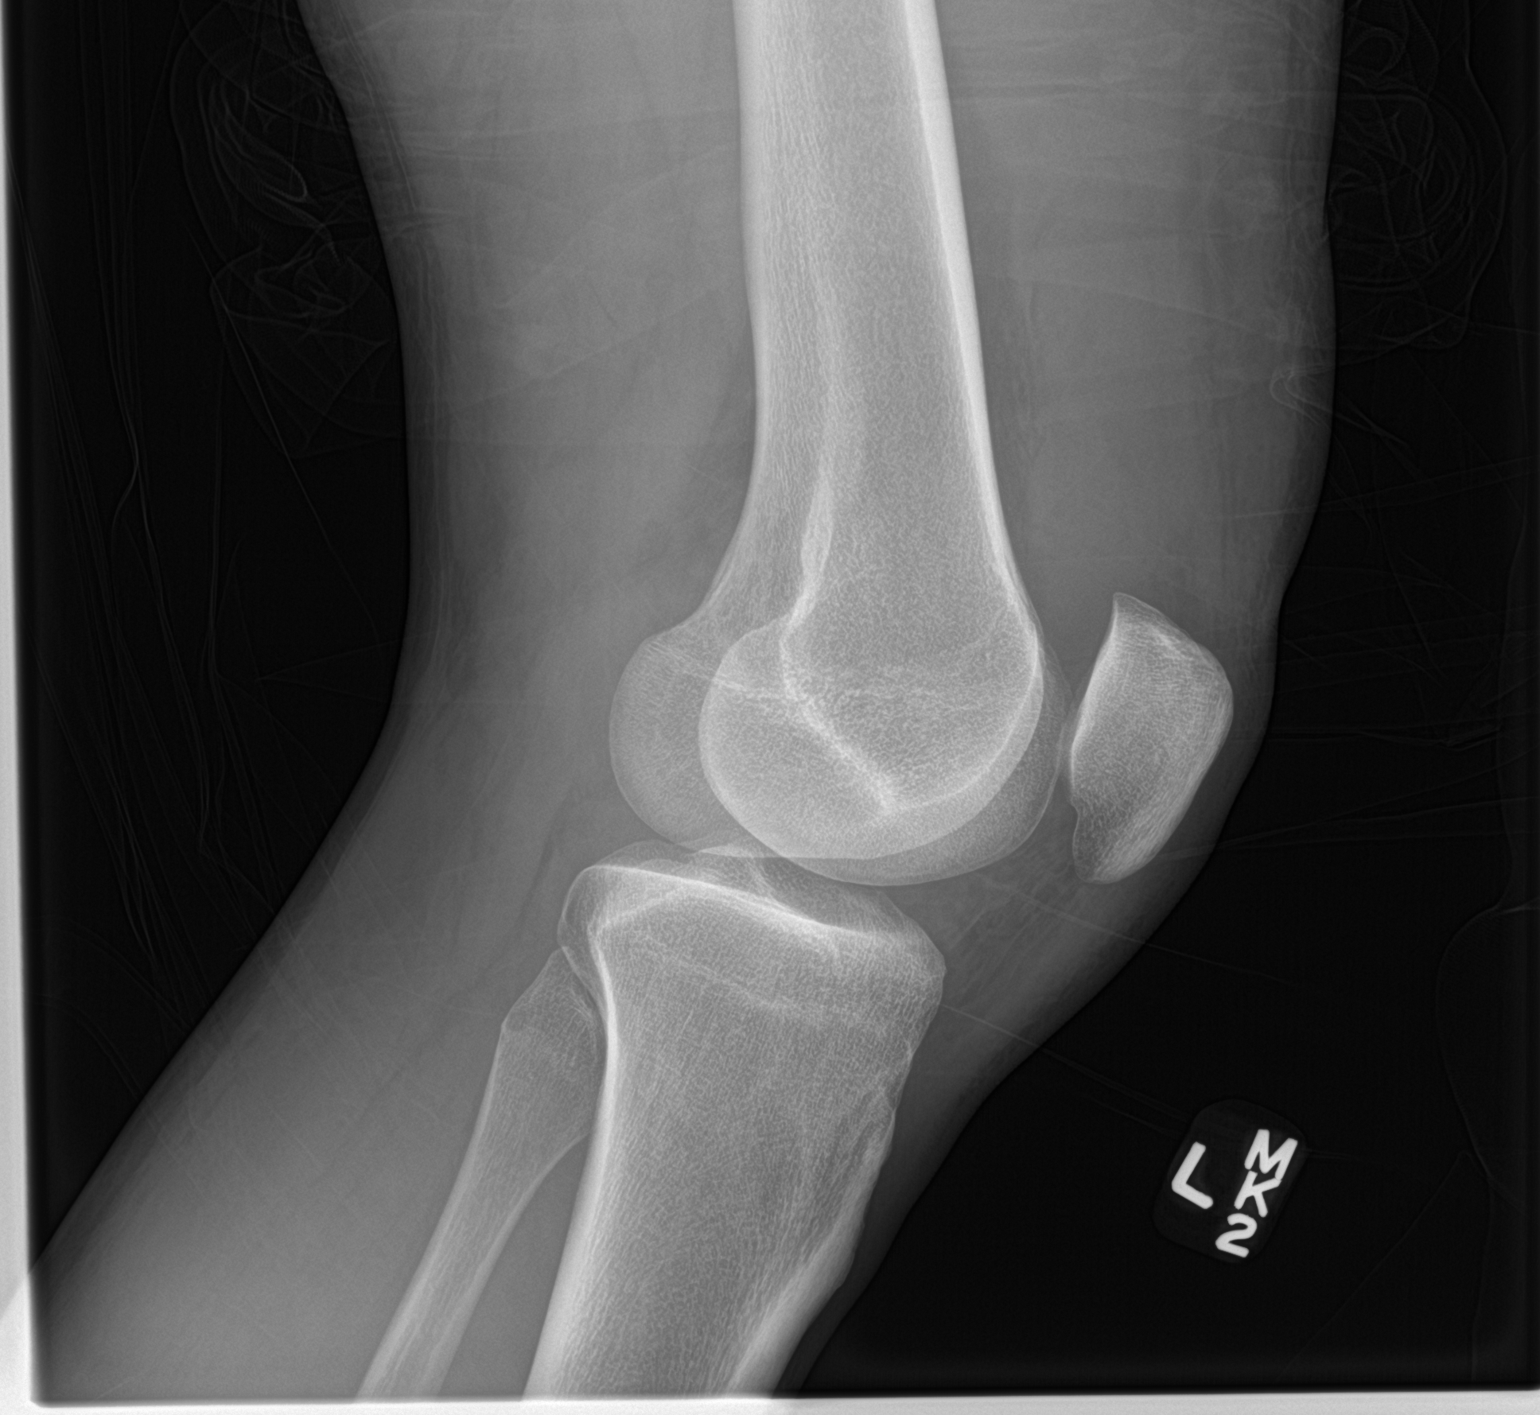

[2 of 2 positions shown; findings below may reference images not displayed]

FINDINGS: There is no fracture or dislocation. However, there is a prominent
joint effusion. Soft tissue prominence posteriorly most likely
represents a Baker's cyst.

No arthritic changes.
IMPRESSION: Prominent joint effusion. Soft tissue prominence posteriorly most
likely represents a Baker's cyst. Otherwise, normal exam. No visible
arthritic changes.

## 2023-02-11 ENCOUNTER — Emergency Department (HOSPITAL_COMMUNITY)

## 2023-02-11 ENCOUNTER — Emergency Department (HOSPITAL_COMMUNITY)
Admission: EM | Admit: 2023-02-11 | Discharge: 2023-02-11 | Attending: Emergency Medicine | Admitting: Emergency Medicine

## 2023-02-11 ENCOUNTER — Other Ambulatory Visit: Payer: Self-pay

## 2023-02-11 ENCOUNTER — Encounter (HOSPITAL_COMMUNITY): Payer: Self-pay | Admitting: Emergency Medicine

## 2023-02-11 DIAGNOSIS — Y92000 Kitchen of unspecified non-institutional (private) residence as  the place of occurrence of the external cause: Secondary | ICD-10-CM | POA: Insufficient documentation

## 2023-02-11 DIAGNOSIS — M545 Low back pain, unspecified: Secondary | ICD-10-CM | POA: Insufficient documentation

## 2023-02-11 DIAGNOSIS — W0110XA Fall on same level from slipping, tripping and stumbling with subsequent striking against unspecified object, initial encounter: Secondary | ICD-10-CM | POA: Insufficient documentation

## 2023-02-11 DIAGNOSIS — M542 Cervicalgia: Secondary | ICD-10-CM | POA: Insufficient documentation

## 2023-02-11 DIAGNOSIS — R519 Headache, unspecified: Secondary | ICD-10-CM | POA: Diagnosis not present

## 2023-02-11 DIAGNOSIS — W19XXXA Unspecified fall, initial encounter: Secondary | ICD-10-CM

## 2023-02-11 MED ORDER — KETOROLAC TROMETHAMINE 15 MG/ML IJ SOLN
15.0000 mg | Freq: Once | INTRAMUSCULAR | Status: AC
  Administered 2023-02-11: 15 mg via INTRAVENOUS
  Filled 2023-02-11: qty 1

## 2023-02-11 NOTE — ED Provider Notes (Signed)
[place] [facility] AT [facility] Provider Note   CSN: [id number] Arrival date & time: 02/11/23  1730     History  Chief Complaint  Patient presents with   Rodney Chandler. is a 37 y.o. male.  Patient with history of concussion, schizoaffective disorder presents today with [employer] from [facility] with complaints of fall.  He states that same occurred immediately prior to arrival today when he slipped on the wet floor in the kitchen.  States that he fell backwards and struck his head.  He did not lose consciousness.  He is endorsing pain to his lower back, neck, and head.  He denies any vision changes, nausea, or vomiting.  No sharp shooting pain down his arms or legs or numbness/tingling in his extremities.  No loss of bowel or Badik bladder function or saddle paresthesias.  He was able to walk since the fall with pain.  The history is provided by the patient. No language interpreter was used.  Fall       Home Medications Prior to Admission medications   Medication Sig Start Date End Date Taking? Authorizing Provider  ARIPiprazole (ABILIFY) 10 MG tablet Take 1 tablet (10 mg total) by mouth 2 (two) times daily. 12/01/15   Rolly Salter, MD  benztropine (COGENTIN) 0.5 MG tablet Take 1 tablet (0.5 mg total) by mouth 2 (two) times daily as needed for tremors. 12/01/15   Rolly Salter, MD  cyclobenzaprine (FLEXERIL) 5 MG tablet Take 1-2 tablets (5-10 mg total) by mouth 2 (two) times daily as needed for muscle spasms. 08/29/19   Wieters, Hallie C, PA-C  diphenhydrAMINE (BENADRYL) 25 mg capsule Take 1 capsule (25 mg total) by mouth every 6 (six) hours as needed for itching (agitation). 12/01/15   Rolly Salter, MD  ibuprofen (ADVIL) 800 MG tablet Take 1 tablet (800 mg total) by mouth 3 (three) times daily. 08/29/19   Wieters, Hallie C, PA-C  LORazepam (ATIVAN) 2 MG tablet Take 1 tablet (2 mg total) by mouth 3 (three) times daily. 12/01/15   Rolly Salter, MD   naproxen (NAPROSYN) 500 MG tablet Take 1 tablet (500 mg total) by mouth 2 (two) times daily. 10/23/19   Lorelee New, PA-C  senna-docusate (SENOKOT-S) 8.6-50 MG tablet Take 1 tablet by mouth at bedtime as needed for mild constipation. 12/01/15   Rolly Salter, MD  traZODone (DESYREL) 100 MG tablet Take 1 tablet (100 mg total) by mouth at bedtime. 09/20/15   Earney Navy, NP  amantadine (SYMMETREL) 100 MG capsule Take 1 capsule (100 mg total) by mouth 2 (two) times daily. Patient not taking: Reported on 10/29/2015 09/20/15 08/29/19  Earney Navy, NP      Allergies    Prolixin [fluphenazine], Lithium, Depakote [divalproex sodium], and Zyprexa [olanzapine]    Review of Systems   Review of Systems  Musculoskeletal:  Positive for arthralgias, back pain and myalgias.  All other systems reviewed and are negative.   Physical Exam Updated Vital Signs BP 111/62   Pulse 61   Temp 98.3 F (36.8 C) (Oral)   Resp (!) 22   Ht 5\' 11"  (1.803 m)   Wt 70.3 kg   SpO2 100%   BMI 21.62 kg/m  Physical Exam Vitals and nursing note reviewed.  Constitutional:      General: He is not in acute distress.    Appearance: Normal appearance. He is normal weight. He is not ill-appearing, toxic-appearing  or diaphoretic.  HENT:     Head: Normocephalic and atraumatic.     Comments: No Battle sign or raccoon eyes Eyes:     Extraocular Movements: Extraocular movements intact.     Pupils: Pupils are equal, round, and reactive to light.  Neck:     Comments: Patient arrives in c-collar Cardiovascular:     Rate and Rhythm: Normal rate.  Pulmonary:     Effort: Pulmonary effort is normal. No respiratory distress.  Abdominal:     General: Abdomen is flat.     Palpations: Abdomen is soft.     Tenderness: There is no abdominal tenderness.  Musculoskeletal:        General: Normal range of motion.     Cervical back: Normal range of motion and neck supple.     Comments: No tenderness to palpation  of thoracic spine. TTP of lumbar spine.  No step-offs, lesions, deformity, or overlying skin changes. Ambulatory with steady gait. 5/5 strength and sensation intact to bilateral upper and lower extremities  Skin:    General: Skin is warm and dry.  Neurological:     General: No focal deficit present.     Mental Status: He is alert and oriented to person, place, and time.  Psychiatric:        Mood and Affect: Mood normal.        Behavior: Behavior normal.     ED Results / Procedures / Treatments   Labs (all labs ordered are listed, but only abnormal results are displayed) Labs Reviewed - No data to display  EKG None  Radiology CT Lumbar Spine Wo Contrast  Result Date: 02/11/2023 CLINICAL DATA:  Fall, back EXAM: CT LUMBAR SPINE WITHOUT CONTRAST TECHNIQUE: Multidetector CT imaging of the lumbar spine was performed without intravenous contrast administration. Multiplanar CT image reconstructions were also generated. RADIATION DOSE REDUCTION: This exam was performed according to the departmental dose-optimization program which includes automated exposure control, adjustment of the mA and/or kV according to patient size and/or use of iterative reconstruction technique. COMPARISON:  No prior CT of the lumbar spine FINDINGS: Segmentation: 5 lumbar type vertebral bodies. The lowest fully formed disc space is labeled L5-S1. Alignment: No listhesis. Vertebrae: No acute fracture or suspicious osseous lesion. Paraspinal and other soft tissues: No acute finding. Disc levels: Disc heights are preserved. Minimal disc bulges at L4-L5 and L5-S1. No spinal canal stenosis or neural foraminal narrowing. IMPRESSION: 1. No acute lumbar spine fracture. 2. No spinal canal stenosis or neural foraminal narrowing. Electronically Signed   By: Wiliam Ke M.D.   On: 02/11/2023 19:20   CT Head Wo Contrast  Result Date: 02/11/2023 CLINICAL DATA:  Fall EXAM: CT HEAD WITHOUT CONTRAST CT CERVICAL SPINE WITHOUT CONTRAST  TECHNIQUE: Multidetector CT imaging of the head and cervical spine was performed following the standard protocol without intravenous contrast. Multiplanar CT image reconstructions of the cervical spine were also generated. RADIATION DOSE REDUCTION: This exam was performed according to the departmental dose-optimization program which includes automated exposure control, adjustment of the mA and/or kV according to patient size and/or use of iterative reconstruction technique. COMPARISON:  None Available. FINDINGS: CT HEAD FINDINGS Brain: No evidence of acute infarction, hemorrhage, hydrocephalus, extra-axial collection or mass lesion/mass effect. Vascular: No hyperdense vessel or unexpected calcification. Skull: Normal. Negative for fracture or focal lesion. Sinuses/Orbits: No middle ear or mastoid effusion. Paranasal sinuses are clear. Bilateral orbits are unremarkable. Other: None. CT CERVICAL SPINE FINDINGS Alignment: Normal. Skull base and vertebrae: No acute  fracture. No primary bone lesion or focal pathologic process. Soft tissues and spinal canal: No prevertebral fluid or swelling. No visible canal hematoma. Disc levels:  No evidence of high-grade spinal canal stenosis. Upper chest: Negative. Other: None IMPRESSION: CT HEAD: No acute intracranial abnormality. CT CERVICAL SPINE: No acute fracture or traumatic subluxation of the cervical spine. Electronically Signed   By: Lorenza Cambridge M.D.   On: 02/11/2023 19:19   CT Cervical Spine Wo Contrast  Result Date: 02/11/2023 CLINICAL DATA:  Fall EXAM: CT HEAD WITHOUT CONTRAST CT CERVICAL SPINE WITHOUT CONTRAST TECHNIQUE: Multidetector CT imaging of the head and cervical spine was performed following the standard protocol without intravenous contrast. Multiplanar CT image reconstructions of the cervical spine were also generated. RADIATION DOSE REDUCTION: This exam was performed according to the departmental dose-optimization program which includes automated exposure  control, adjustment of the mA and/or kV according to patient size and/or use of iterative reconstruction technique. COMPARISON:  None Available. FINDINGS: CT HEAD FINDINGS Brain: No evidence of acute infarction, hemorrhage, hydrocephalus, extra-axial collection or mass lesion/mass effect. Vascular: No hyperdense vessel or unexpected calcification. Skull: Normal. Negative for fracture or focal lesion. Sinuses/Orbits: No middle ear or mastoid effusion. Paranasal sinuses are clear. Bilateral orbits are unremarkable. Other: None. CT CERVICAL SPINE FINDINGS Alignment: Normal. Skull base and vertebrae: No acute fracture. No primary bone lesion or focal pathologic process. Soft tissues and spinal canal: No prevertebral fluid or swelling. No visible canal hematoma. Disc levels:  No evidence of high-grade spinal canal stenosis. Upper chest: Negative. Other: None IMPRESSION: CT HEAD: No acute intracranial abnormality. CT CERVICAL SPINE: No acute fracture or traumatic subluxation of the cervical spine. Electronically Signed   By: Lorenza Cambridge M.D.   On: 02/11/2023 19:19    Procedures Procedures    Medications Ordered in ED Medications  ketorolac (TORADOL) 15 MG/ML injection 15 mg (15 mg Intravenous Given 02/11/23 1827)    ED Course/ Medical Decision Making/ A&P                             Medical Decision Making Amount and/or Complexity of Data Reviewed Radiology: ordered.  Risk Prescription drug management.   Patient presents today with complaints of fall earlier today. He is afebrile, non-toxic appearing, and in no acute distress with reassuring vital signs. Patient arrives in a c collar, endorsing head, neck, and back pain. Patient without signs of serious head, neck, or back injury. Mild TTP of lumbar spine, no other midline spinal tenderness or TTP of the chest or abd.  Normal neurological exam. No concern for closed head injury, lung injury, or intraabdominal injury. Normal muscle soreness after  fall.  CT imaging obtained of the head, cervical spine, and lumbar spine which has resulted and reveals no acute findings.  I personally reviewed and interpreted this imaging and agree with radiology interpretation.   Patient is able to ambulate without difficulty in the ED.  Pt is hemodynamically stable, in NAD.   Pain has been managed & pt has no complaints prior to dc.  Patient counseled on typical course of muscle stiffness and soreness post-fall. Discussed s/s that should cause them to return. Patient instructed on NSAID use. Evaluation and diagnostic testing in the emergency department does not suggest an emergent condition requiring admission or immediate intervention beyond what has been performed at this time.  Plan for discharge with close PCP follow-up.  Patient is understanding and amenable with plan, educated  on red flag symptoms that would prompt immediate return.  [facility].  Final Clinical Impression(s) / ED Diagnoses Final diagnoses:  Fall, initial encounter    Rx / DC Orders ED Discharge Orders     None     An After Visit Summary was printed and given to the patient.     Vear Clock 02/11/23 2036    Jacalyn Lefevre, MD 02/11/23 [phone]

## 2023-02-11 NOTE — ED Triage Notes (Addendum)
Pt c/o fall in kitchen. Pt c/o head, leg and back pain. Facility states he has multiple falls with same symptoms. Fall was unwitnessed. Pt has c-collar in place

## 2023-02-11 NOTE — Discharge Instructions (Signed)
As we discussed, your workup in the ER today was reassuring for acute findings.  CT imaging of your head, neck, and back did not reveal any emergent concerns.  I suspect that you are experiencing muscle soreness as a result of your fall.  I recommend that you take Tylenol/ibuprofen for this and rest, ice, compress, and elevate areas that are particularly sore.  Follow-up with your primary care doctor as needed  Return if development of any new or worsening symptoms.
